# Patient Record
Sex: Male | Born: 1980 | Race: Black or African American | Hispanic: No | Marital: Single | Smoking: Never smoker
Health system: Southern US, Community
[De-identification: ages and names within clinical notes are randomized; demographics above are authoritative.]

## PROBLEM LIST (undated history)

## (undated) DIAGNOSIS — Z006 Encounter for examination for normal comparison and control in clinical research program: Secondary | ICD-10-CM

## (undated) DIAGNOSIS — L309 Dermatitis, unspecified: Secondary | ICD-10-CM

## (undated) DIAGNOSIS — F329 Major depressive disorder, single episode, unspecified: Secondary | ICD-10-CM

## (undated) HISTORY — DX: Encounter for examination for normal comparison and control in clinical research program: Z00.6

## (undated) HISTORY — DX: Major depressive disorder, single episode, unspecified: F32.9

## (undated) HISTORY — DX: Dermatitis, unspecified: L30.9

---
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
## 2008-03-25 DIAGNOSIS — L309 Dermatitis, unspecified: Secondary | ICD-10-CM

## 2008-03-25 HISTORY — DX: Dermatitis, unspecified: L30.9

## 2010-03-25 DIAGNOSIS — F32A Depression, unspecified: Secondary | ICD-10-CM

## 2010-03-25 HISTORY — DX: Depression, unspecified: F32.A

## 2016-02-01 ENCOUNTER — Encounter (INDEPENDENT_AMBULATORY_CARE_PROVIDER_SITE_OTHER): Payer: Self-pay | Admitting: *Deleted

## 2016-02-01 ENCOUNTER — Encounter: Payer: Self-pay | Admitting: *Deleted

## 2016-02-01 VITALS — BP 114/78 | HR 88 | Temp 98.2°F | Wt 204.2 lb

## 2016-02-01 DIAGNOSIS — Z006 Encounter for examination for normal comparison and control in clinical research program: Secondary | ICD-10-CM

## 2016-02-01 LAB — CBC WITH DIFFERENTIAL/PLATELET
Basophils Absolute: 0 cells/uL (ref 0–200)
Basophils Relative: 0 %
Eosinophils Absolute: 45 cells/uL (ref 15–500)
Eosinophils Relative: 1 %
HCT: 48.4 % (ref 38.5–50.0)
Hemoglobin: 15.8 g/dL (ref 13.2–17.1)
Lymphocytes Relative: 39 %
Lymphs Abs: 1755 cells/uL (ref 850–3900)
MCH: 29.1 pg (ref 27.0–33.0)
MCHC: 32.6 g/dL (ref 32.0–36.0)
MCV: 89.1 fL (ref 80.0–100.0)
MPV: 11.4 fL (ref 7.5–12.5)
Monocytes Absolute: 450 cells/uL (ref 200–950)
Monocytes Relative: 10 %
Neutro Abs: 2250 cells/uL (ref 1500–7800)
Neutrophils Relative %: 50 %
Platelets: 223 10*3/uL (ref 140–400)
RBC: 5.43 MIL/uL (ref 4.20–5.80)
RDW: 13.4 % (ref 11.0–15.0)
WBC: 4.5 10*3/uL (ref 3.8–10.8)

## 2016-02-01 NOTE — Progress Notes (Addendum)
Study: A Phase 2b/3 Double Blind Safety and Efficacy Study of Injectable Cabotegravir compared to Daily Oral Tenofovir Disoproxil Fumarate/Emtricitabine (TDF/FTC), For Pre-Exposure Prophylaxis in HIV-Uninfected Cisgender Men and Transgender Women who have sex with Men.  Medication: Investigational Injectable Cabotegravir/placebo compared to Truvada/placebo. Duration: Around 4 years.  Glenn Rivera is here for [id number] screening visit. After verifying the correct version I explained/reviewed the informed consent in the language that he understood. Risk, benefits, responsibilities, and other options were reviewed. I answered his questions. Comprehension was assessed. He was given adequate time to consider his options. He verbalized understanding and signed the consent witnessed by me. I then gave him a copy of the consent.  HIV counseling was given including description of the testing and how it is done; explained HIV and how it is spread and ways to prevent it; Discussed the meaning of the possible test results and what impact the test results may have on the participant. SexPro = 14. He states he was positive for urethral gonorrhea but does not know if it has been with in the past 6 months. PTID assigned. Confirmation of eligibility for screening was confirmed. Blood drawn at 1515 and HIV Rapid confirmed to be negative. Medical history, medications, bleeding history, and signs/symptoms were reviewed. ECG and vitals were obtained. QTcB = 402 ms.  Complete PE to be performed at entry. He received $50 gift card for screening visit. If deemed eligible and he is willing to participant in the study then anticipated entry visit is scheduled for 02/12/2016.

## 2016-02-02 LAB — CK: Total CK: 325 U/L — ABNORMAL HIGH (ref 7–232)

## 2016-02-02 LAB — HEPATITIS B SURFACE ANTIGEN: Hepatitis B Surface Ag: NEGATIVE

## 2016-02-02 LAB — HEPATITIS C ANTIBODY: HCV Ab: NEGATIVE

## 2016-02-02 LAB — HIV ANTIBODY (ROUTINE TESTING W REFLEX): HIV 1&2 Ab, 4th Generation: NONREACTIVE

## 2016-02-05 LAB — HIV-1 RNA, QUALITATIVE, TMA: HIV-1 RNA, Qualitative, TMA: NOT DETECTED

## 2016-02-12 ENCOUNTER — Encounter (INDEPENDENT_AMBULATORY_CARE_PROVIDER_SITE_OTHER): Payer: Self-pay | Admitting: *Deleted

## 2016-02-12 ENCOUNTER — Encounter: Payer: Self-pay | Admitting: *Deleted

## 2016-02-12 VITALS — BP 121/79 | HR 76 | Temp 98.3°F | Wt 206.0 lb

## 2016-02-12 DIAGNOSIS — Z006 Encounter for examination for normal comparison and control in clinical research program: Secondary | ICD-10-CM

## 2016-02-12 HISTORY — DX: Encounter for examination for normal comparison and control in clinical research program: Z00.6

## 2016-02-12 LAB — LIPASE: LIPASE: 22 U/L (ref 7–60)

## 2016-02-12 LAB — CBC WITH DIFFERENTIAL/PLATELET
Basophils Absolute: 0 cells/uL (ref 0–200)
Basophils Relative: 0 %
Eosinophils Absolute: 76 cells/uL (ref 15–500)
Eosinophils Relative: 2 %
HCT: 46.1 % (ref 38.5–50.0)
Hemoglobin: 15.2 g/dL (ref 13.2–17.1)
Lymphocytes Relative: 34 %
Lymphs Abs: 1292 cells/uL (ref 850–3900)
MCH: 29.1 pg (ref 27.0–33.0)
MCHC: 33 g/dL (ref 32.0–36.0)
MCV: 88.3 fL (ref 80.0–100.0)
MPV: 11.4 fL (ref 7.5–12.5)
Monocytes Absolute: 266 cells/uL (ref 200–950)
Monocytes Relative: 7 %
Neutro Abs: 2166 cells/uL (ref 1500–7800)
Neutrophils Relative %: 57 %
Platelets: 225 10*3/uL (ref 140–400)
RBC: 5.22 MIL/uL (ref 4.20–5.80)
RDW: 13.7 % (ref 11.0–15.0)
WBC: 3.8 10*3/uL (ref 3.8–10.8)

## 2016-02-12 LAB — COMPREHENSIVE METABOLIC PANEL
ALT: 47 U/L — ABNORMAL HIGH (ref 9–46)
AST: 122 U/L — ABNORMAL HIGH (ref 10–40)
Albumin: 4.2 g/dL (ref 3.6–5.1)
Alkaline Phosphatase: 51 U/L (ref 40–115)
BUN: 10 mg/dL (ref 7–25)
CO2: 30 mmol/L (ref 20–31)
Calcium: 9.2 mg/dL (ref 8.6–10.3)
Chloride: 101 mmol/L (ref 98–110)
Creat: 1.09 mg/dL (ref 0.60–1.35)
Glucose, Bld: 86 mg/dL (ref 65–99)
Potassium: 4.1 mmol/L (ref 3.5–5.3)
Sodium: 137 mmol/L (ref 135–146)
Total Bilirubin: 0.6 mg/dL (ref 0.2–1.2)
Total Protein: 6.9 g/dL (ref 6.1–8.1)

## 2016-02-12 LAB — LIPID PANEL
Cholesterol: 162 mg/dL (ref <200)
HDL: 89 mg/dL (ref >40)
LDL Cholesterol: 65 mg/dL (ref <100)
Total CHOL/HDL Ratio: 1.8 Ratio (ref <5.0)
Triglycerides: 41 mg/dL (ref <150)
VLDL: 8 mg/dL (ref <30)

## 2016-02-12 LAB — AMYLASE: Amylase: 47 U/L (ref 0–105)

## 2016-02-12 LAB — PHOSPHORUS: PHOSPHORUS: 3.4 mg/dL (ref 2.5–4.5)

## 2016-02-12 LAB — HIV ANTIBODY (ROUTINE TESTING W REFLEX): HIV: NONREACTIVE

## 2016-02-12 LAB — CK: Total CK: 13864 U/L (ref 7–232)

## 2016-02-12 LAB — HEPATITIS B CORE ANTIBODY, TOTAL: HEP B C TOTAL AB: NONREACTIVE

## 2016-02-12 LAB — HEPATITIS B SURFACE ANTIBODY,QUALITATIVE: Hep B S Ab: NEGATIVE

## 2016-02-12 NOTE — Progress Notes (Signed)
Subjective:    Patient ID: Glenn ReichmannDaniel Rivera, male    DOB: 01/10/1981, 35 y.o.   MRN: [id number]  HPI  Glenn BoomDaniel Is a 35 year old African-American man here for comprehensive physical exam prior to entry into H PT and 083:  A Phase 2b/3 Double Blind Safety and Efficacy Study of Injectable Cabotegravir compared to Daily Oral Tenofovir Disoproxil Fumarate/Emtricitabine (TDF/FTC), For Pre-Exposure Prophylaxis in HIV-Uninfected Cisgender Men and Transgender Women who have sex with Men.    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   He does at times experience eczema on his feet when he is stressed out. He also has a tooth that is broken in need of dental care.  Past Medical History:  Diagnosis Date  . Depression 03/25/2010   History  . Eczema 2010   intermittent / seasonal on soles of feet bilaterally  . Examination of participant or control in clinical research 02/12/2016    No past surgical history on file.  No family history on file.    Social History   Social History  . Marital status: Single    Spouse name: N/A  . Number of children: N/A  . Years of education: N/A   Social History Main Topics  . Smoking status: Never Smoker  . Smokeless tobacco: Never Used  . Alcohol use 1.2 oz/week    2 Shots of liquor per week     Comment: 1x/month  . Drug use: No  . Sexual activity: Yes     Comment: Pansexual   Other Topics Concern  . None   Social History Narrative  . None    Allergies  Allergen Reactions  . Lactose Intolerance (Gi) Diarrhea and Other (See Comments)    Diarrhea, upset stomach, and migraine    No current outpatient prescriptions on file.   Review of Systems  Constitutional: Negative for chills, diaphoresis and fever.  HENT: Negative for congestion, hearing loss, sore throat and tinnitus.   Respiratory: Negative for cough, shortness of breath and wheezing.   Cardiovascular: Negative for chest pain, palpitations and  leg swelling.  Gastrointestinal: Negative for abdominal pain, blood in stool, constipation, diarrhea, nausea and vomiting.  Genitourinary: Negative for dysuria, flank pain and hematuria.  Musculoskeletal: Negative for back pain and myalgias.  Skin: Negative for rash.  Neurological: Negative for dizziness, weakness and headaches.  Hematological: Does not bruise/bleed easily.  Psychiatric/Behavioral: Negative for suicidal ideas. The patient is not nervous/anxious.        Objective:   Physical Exam  Constitutional: He is oriented to person, place, and time. He appears well-developed and well-nourished. No distress.  HENT:  Head: Normocephalic and atraumatic.  Mouth/Throat: Oropharynx is clear and moist. No oropharyngeal exudate.    Eyes: Conjunctivae and EOM are normal. Pupils are equal, round, and reactive to light. Right eye exhibits no discharge. Left eye exhibits no discharge. No scleral icterus.  Neck: Normal range of motion. Neck supple. No JVD present. No tracheal deviation present. No thyromegaly present.  Cardiovascular: Normal rate, regular rhythm and normal heart sounds.  Exam reveals no gallop and no friction rub.   No murmur heard. Pulmonary/Chest: Effort normal and breath sounds normal. No respiratory distress. He has no wheezes. He has no rales. He exhibits no tenderness.  Abdominal: Soft. Bowel sounds are normal. He exhibits no distension. There is no tenderness. There is no rebound.  Musculoskeletal: He exhibits no edema or tenderness.  Lymphadenopathy:       Head (  right side): No submental, no submandibular, no tonsillar, no preauricular, no posterior auricular and no occipital adenopathy present.       Head (left side): No submental, no submandibular, no tonsillar, no preauricular, no posterior auricular and no occipital adenopathy present.    He has no cervical adenopathy.       Right: No supraclavicular adenopathy present.       Left: No supraclavicular adenopathy  present.  Neurological: He is alert and oriented to person, place, and time. No cranial nerve deficit or sensory deficit. He exhibits normal muscle tone. Coordination normal. GCS eye subscore is 4. GCS verbal subscore is 5. GCS motor subscore is 6.  Skin: Skin is warm and dry. No rash noted. He is not diaphoretic. No erythema. No pallor.  Psychiatric: He has a normal mood and affect. His behavior is normal. Judgment and thought content normal.          Assessment & Plan:   Normal CPE. EKG was normal sinus rhythm labs were normal other than slightly elevated CPK which is due to his exercise.  All questions were answered asked and answered.  Patient should be ready to start study drugs today.

## 2016-02-12 NOTE — Progress Notes (Addendum)
Study: A Phase 2b/3 Double Blind Safety and Efficacy Study of Injectable Cabotegravir compared to Daily Oral Tenofovir Disoproxil Fumarate/Emtricitabine (TDF/FTC), For Pre-Exposure Prophylaxis in HIV-Uninfected Cisgender Men and Transgender Women who have sex with Men.  Medication: Investigational Injectable Cabotegravir/placebo compared to Truvada/placebo. Duration: Around 4 years.  Reuel BoomDaniel is here for entry. Was seen by PI for CPE. HIV rapid was negative. He reports no new issues since screening visit. He was randomized to study. Questionnaires completed; vitals obtained; Discussed proper administration of study product, potential side effects, and contact information incase he has any concerns/questions. He verbalized understanding. He plans to start study drug tonight and will use a reminder on phone as well as support from friends to remember to take study meds. Will see him in 2 weeks for adherence and safety check. He received $50 giftcard for visit and condoms/lube. Tacey HeapElisha Shameria Trimarco RN

## 2016-02-13 LAB — GC/CHLAMYDIA PROBE AMP
CT Probe RNA: NOT DETECTED
GC PROBE AMP APTIMA: NOT DETECTED

## 2016-02-13 LAB — URINALYSIS
Bilirubin Urine: NEGATIVE
Glucose, UA: NEGATIVE
HGB URINE DIPSTICK: NEGATIVE
Ketones, ur: NEGATIVE
NITRITE: NEGATIVE
PROTEIN: NEGATIVE
Specific Gravity, Urine: 1.012 (ref 1.001–1.035)
pH: 8 (ref 5.0–8.0)

## 2016-02-13 LAB — RPR

## 2016-02-15 LAB — CT/NG RNA, TMA RECTAL
Chlamydia Trachomatis RNA: NOT DETECTED
Neisseria Gonorrhoeae RNA: NOT DETECTED

## 2016-02-19 ENCOUNTER — Encounter: Payer: Self-pay | Admitting: *Deleted

## 2016-02-19 DIAGNOSIS — Z006 Encounter for examination for normal comparison and control in clinical research program: Secondary | ICD-10-CM

## 2016-02-19 LAB — COMPREHENSIVE METABOLIC PANEL
ALBUMIN: 4.2 g/dL (ref 3.6–5.1)
ALK PHOS: 56 U/L (ref 40–115)
ALT: 56 U/L — AB (ref 9–46)
AST: 46 U/L — AB (ref 10–40)
BILIRUBIN TOTAL: 0.4 mg/dL (ref 0.2–1.2)
BUN: 11 mg/dL (ref 7–25)
CALCIUM: 9.3 mg/dL (ref 8.6–10.3)
CO2: 26 mmol/L (ref 20–31)
CREATININE: 1.14 mg/dL (ref 0.60–1.35)
Chloride: 105 mmol/L (ref 98–110)
Glucose, Bld: 83 mg/dL (ref 65–99)
Potassium: 4.3 mmol/L (ref 3.5–5.3)
Sodium: 140 mmol/L (ref 135–146)
TOTAL PROTEIN: 6.8 g/dL (ref 6.1–8.1)

## 2016-02-19 LAB — CK: Total CK: 1080 U/L — ABNORMAL HIGH (ref 7–232)

## 2016-02-19 NOTE — Progress Notes (Signed)
Glenn Rivera is here for a redraw of his CPK and CMET prior to him restarting his study meds,. He says he has not worked out the past few days and his muscles are not sore. He will wait to hear from us before resuming his medication.

## 2016-02-23 ENCOUNTER — Encounter: Payer: Self-pay | Admitting: *Deleted

## 2016-02-23 DIAGNOSIS — Z006 Encounter for examination for normal comparison and control in clinical research program: Secondary | ICD-10-CM

## 2016-02-28 NOTE — Assessment & Plan Note (Signed)
After obtaining labs on participant at [id number] and discussing situation with protocol team the participant was started on study drug on [id number]. Participant was informed and will start 01DEC2017.

## 2016-02-28 NOTE — Assessment & Plan Note (Signed)
Site aware that all labs were not obtained at screening visit. Contacted study participant and informed him of the situation and that he SHOULD NOT start study medications. He verbalized understanding. I reported findings to protocol team. Tacey HeapElisha Epperson RN

## 2016-03-04 ENCOUNTER — Encounter (INDEPENDENT_AMBULATORY_CARE_PROVIDER_SITE_OTHER): Payer: Self-pay | Admitting: *Deleted

## 2016-03-04 VITALS — BP 123/75 | HR 69 | Temp 97.7°F | Wt 199.5 lb

## 2016-03-04 DIAGNOSIS — Z006 Encounter for examination for normal comparison and control in clinical research program: Secondary | ICD-10-CM

## 2016-03-04 LAB — CBC WITH DIFFERENTIAL/PLATELET
BASOS PCT: 1 %
Basophils Absolute: 37 cells/uL (ref 0–200)
EOS PCT: 2 %
Eosinophils Absolute: 74 cells/uL (ref 15–500)
HCT: 49.1 % (ref 38.5–50.0)
HEMOGLOBIN: 16.4 g/dL (ref 13.2–17.1)
Lymphocytes Relative: 36 %
Lymphs Abs: 1332 cells/uL (ref 850–3900)
MCH: 29.8 pg (ref 27.0–33.0)
MCHC: 33.4 g/dL (ref 32.0–36.0)
MCV: 89.1 fL (ref 80.0–100.0)
MPV: 11.7 fL (ref 7.5–12.5)
Monocytes Absolute: 333 cells/uL (ref 200–950)
Monocytes Relative: 9 %
NEUTROS ABS: 1924 {cells}/uL (ref 1500–7800)
Neutrophils Relative %: 52 %
Platelets: 223 10*3/uL (ref 140–400)
RBC: 5.51 MIL/uL (ref 4.20–5.80)
RDW: 13.5 % (ref 11.0–15.0)
WBC: 3.7 10*3/uL — AB (ref 3.8–10.8)

## 2016-03-04 LAB — COMPREHENSIVE METABOLIC PANEL
ALK PHOS: 62 U/L (ref 40–115)
ALT: 30 U/L (ref 9–46)
AST: 23 U/L (ref 10–40)
Albumin: 4.2 g/dL (ref 3.6–5.1)
BUN: 12 mg/dL (ref 7–25)
CALCIUM: 9.5 mg/dL (ref 8.6–10.3)
CO2: 28 mmol/L (ref 20–31)
Chloride: 104 mmol/L (ref 98–110)
Creat: 1.15 mg/dL (ref 0.60–1.35)
GLUCOSE: 81 mg/dL (ref 65–99)
POTASSIUM: 4.6 mmol/L (ref 3.5–5.3)
Sodium: 139 mmol/L (ref 135–146)
TOTAL PROTEIN: 6.8 g/dL (ref 6.1–8.1)
Total Bilirubin: 0.6 mg/dL (ref 0.2–1.2)

## 2016-03-04 LAB — PHOSPHORUS: PHOSPHORUS: 3.2 mg/dL (ref 2.5–4.5)

## 2016-03-04 NOTE — Progress Notes (Signed)
Glenn Rivera is here for his week 2 visit for HPTN. He actually started meds on the 3rd. He said [place] was [place] [facility] [place] to start on [place] [facility] and didn't have them with him. Since that time he has been 100% adherent. I reinforced adherence with him and have the next study appt. scheduled for 12/22 (week 4). He says he has not noticed any side effects from the medications or had any other problems.

## 2016-03-05 LAB — AMYLASE: AMYLASE: 44 U/L (ref 0–105)

## 2016-03-05 LAB — CK: Total CK: 195 U/L (ref 7–232)

## 2016-03-05 LAB — HIV ANTIBODY (ROUTINE TESTING W REFLEX): HIV: NONREACTIVE

## 2016-03-05 LAB — LIPASE: Lipase: 43 U/L (ref 7–60)

## 2016-03-15 ENCOUNTER — Encounter (INDEPENDENT_AMBULATORY_CARE_PROVIDER_SITE_OTHER): Payer: Self-pay | Admitting: *Deleted

## 2016-03-15 VITALS — BP 117/81 | HR 91 | Temp 98.3°F | Resp 16 | Wt 199.0 lb

## 2016-03-15 DIAGNOSIS — Z006 Encounter for examination for normal comparison and control in clinical research program: Secondary | ICD-10-CM

## 2016-03-15 LAB — CBC WITH DIFFERENTIAL/PLATELET
BASOS ABS: 40 {cells}/uL (ref 0–200)
BASOS PCT: 1 %
EOS PCT: 2 %
Eosinophils Absolute: 80 cells/uL (ref 15–500)
HCT: 48.8 % (ref 38.5–50.0)
Hemoglobin: 16.2 g/dL (ref 13.2–17.1)
LYMPHS PCT: 42 %
Lymphs Abs: 1680 cells/uL (ref 850–3900)
MCH: 29.8 pg (ref 27.0–33.0)
MCHC: 33.2 g/dL (ref 32.0–36.0)
MCV: 89.9 fL (ref 80.0–100.0)
MONOS PCT: 8 %
MPV: 11.5 fL (ref 7.5–12.5)
Monocytes Absolute: 320 cells/uL (ref 200–950)
Neutro Abs: 1880 cells/uL (ref 1500–7800)
Neutrophils Relative %: 47 %
PLATELETS: 251 10*3/uL (ref 140–400)
RBC: 5.43 MIL/uL (ref 4.20–5.80)
RDW: 13.5 % (ref 11.0–15.0)
WBC: 4 10*3/uL (ref 3.8–10.8)

## 2016-03-15 NOTE — Progress Notes (Signed)
Glenn Rivera is here for his week 4 visit for Study: A Phase 2b/3 Double Blind Safety and Efficacy Study of Injectable Cabotegravir compared to Daily Oral Tenofovir Disoproxil Fumarate/Emtricitabine (TDF/FTC), For Pre-Exposure Prophylaxis in HIV-Uninfected Cisgender Men and Transgender Women who have sex with Men.  Medication: Investigational Injectable Cabotegravir/placebo compared to Truvada/placebo. Duration: Around 4 years.  He had actually started his meds on 12/3 and has taken 16 doses since then which puts his actual adherence at 84%. He had an exposure to dairy 2 days ago and had a reaction yesterday that consisted of migraine, loose stools and slight nausea. He had taken a probiotic for this and melatonin to help him sleep last night. He said he had been having trouble sleeping this past week before that happened. He has also noticed an increase in libido since starting the meds. He will be moving in to the injection phase of the study next week and is scheduled for Thursday at 11:30am.

## 2016-03-16 LAB — COMPREHENSIVE METABOLIC PANEL
ALBUMIN: 4.2 g/dL (ref 3.6–5.1)
ALK PHOS: 67 U/L (ref 40–115)
ALT: 32 U/L (ref 9–46)
AST: 24 U/L (ref 10–40)
BILIRUBIN TOTAL: 0.4 mg/dL (ref 0.2–1.2)
BUN: 12 mg/dL (ref 7–25)
CO2: 22 mmol/L (ref 20–31)
CREATININE: 1.02 mg/dL (ref 0.60–1.35)
Calcium: 9.7 mg/dL (ref 8.6–10.3)
Chloride: 102 mmol/L (ref 98–110)
Glucose, Bld: 90 mg/dL (ref 65–99)
Potassium: 4.3 mmol/L (ref 3.5–5.3)
Sodium: 136 mmol/L (ref 135–146)
Total Protein: 6.9 g/dL (ref 6.1–8.1)

## 2016-03-16 LAB — HIV ANTIBODY (ROUTINE TESTING W REFLEX): HIV 1&2 Ab, 4th Generation: NONREACTIVE

## 2016-03-16 LAB — PHOSPHORUS: Phosphorus: 4.2 mg/dL (ref 2.5–4.5)

## 2016-03-16 LAB — CK: Total CK: 176 U/L (ref 7–232)

## 2016-03-16 LAB — AMYLASE: Amylase: 53 U/L (ref 0–105)

## 2016-03-16 LAB — LIPASE: Lipase: 42 U/L (ref 7–60)

## 2016-03-21 ENCOUNTER — Encounter (INDEPENDENT_AMBULATORY_CARE_PROVIDER_SITE_OTHER): Payer: Self-pay | Admitting: *Deleted

## 2016-03-21 VITALS — BP 129/78 | HR 78 | Temp 98.3°F | Wt 203.5 lb

## 2016-03-21 DIAGNOSIS — Z006 Encounter for examination for normal comparison and control in clinical research program: Secondary | ICD-10-CM

## 2016-03-21 NOTE — Progress Notes (Signed)
Glenn Rivera is here for his week 5 visit for Study: A Phase 2b/3 Double Blind Safety and Efficacy Study of Injectable Cabotegravir compared to Daily Oral Tenofovir Disoproxil Fumarate/Emtricitabine (TDF/FTC), For Pre-Exposure Prophylaxis in HIV-Uninfected Cisgender Men and Transgender Women who have sex with Men.  Medication: Investigational Injectable Cabotegravir/placebo compared to Truvada/placebo. Duration: Around 4 years.  This is his first injection visit. We confirmed his rapid HIV test was negative prior to the injection. He denied any symptoms consistent with acute HIV, though he did have 1 unprotected encounter with a new partner. He says he is sleeping better and not needing to use the melatonin right now. The injection was given in his rt buttock without problem. He was instructed to call for any problems and will return next week for safety labs.

## 2016-03-22 LAB — HIV ANTIBODY (ROUTINE TESTING W REFLEX): HIV: NONREACTIVE

## 2016-03-28 ENCOUNTER — Encounter (INDEPENDENT_AMBULATORY_CARE_PROVIDER_SITE_OTHER): Payer: Self-pay | Admitting: *Deleted

## 2016-03-28 VITALS — BP 111/73 | HR 82 | Temp 97.8°F | Wt 198.5 lb

## 2016-03-28 DIAGNOSIS — Z006 Encounter for examination for normal comparison and control in clinical research program: Secondary | ICD-10-CM

## 2016-03-28 LAB — CBC WITH DIFFERENTIAL/PLATELET
BASOS PCT: 0 %
Basophils Absolute: 0 cells/uL (ref 0–200)
EOS ABS: 74 {cells}/uL (ref 15–500)
Eosinophils Relative: 2 %
HEMATOCRIT: 50 % (ref 38.5–50.0)
HEMOGLOBIN: 16.7 g/dL (ref 13.2–17.1)
Lymphocytes Relative: 38 %
Lymphs Abs: 1406 cells/uL (ref 850–3900)
MCH: 29.7 pg (ref 27.0–33.0)
MCHC: 33.4 g/dL (ref 32.0–36.0)
MCV: 89 fL (ref 80.0–100.0)
MONO ABS: 296 {cells}/uL (ref 200–950)
MPV: 11.4 fL (ref 7.5–12.5)
Monocytes Relative: 8 %
NEUTROS ABS: 1924 {cells}/uL (ref 1500–7800)
Neutrophils Relative %: 52 %
Platelets: 259 10*3/uL (ref 140–400)
RBC: 5.62 MIL/uL (ref 4.20–5.80)
RDW: 13.4 % (ref 11.0–15.0)
WBC: 3.7 10*3/uL — AB (ref 3.8–10.8)

## 2016-03-28 LAB — COMPREHENSIVE METABOLIC PANEL
ALK PHOS: 59 U/L (ref 40–115)
ALT: 29 U/L (ref 9–46)
AST: 23 U/L (ref 10–40)
Albumin: 4 g/dL (ref 3.6–5.1)
BILIRUBIN TOTAL: 0.4 mg/dL (ref 0.2–1.2)
BUN: 9 mg/dL (ref 7–25)
CALCIUM: 9.3 mg/dL (ref 8.6–10.3)
CO2: 25 mmol/L (ref 20–31)
Chloride: 102 mmol/L (ref 98–110)
Creat: 1.11 mg/dL (ref 0.60–1.35)
GLUCOSE: 70 mg/dL (ref 65–99)
POTASSIUM: 4.1 mmol/L (ref 3.5–5.3)
Sodium: 140 mmol/L (ref 135–146)
TOTAL PROTEIN: 6.7 g/dL (ref 6.1–8.1)

## 2016-03-28 LAB — PHOSPHORUS: PHOSPHORUS: 2.9 mg/dL (ref 2.5–4.5)

## 2016-03-28 NOTE — Progress Notes (Signed)
Study: A Phase 2b/3 Double Blind Safety and Efficacy Study of Injectable Cabotegravir compared to Daily Oral Tenofovir Disoproxil Fumarate/Emtricitabine (TDF/FTC), For Pre-Exposure Prophylaxis in HIV-Uninfected Cisgender Men and Transgender Women who have sex with Men.  Medication: Investigational Injectable Cabotegravir/placebo compared to Truvada/placebo. Duration: Around 4 years.  Glenn Rivera is here for week 6 visit. Denies any injection site reaction. Verbalized excellent adherence with his oral study medication. Rapid HIV non-reactive. Risk reduction counseling provided. Declined condoms and lube stating that he "had plenty". Next visit scheduled for 04/16/16 @ 8:30am.

## 2016-03-29 LAB — HIV ANTIBODY (ROUTINE TESTING W REFLEX): HIV 1&2 Ab, 4th Generation: NONREACTIVE

## 2016-03-29 LAB — LIPASE: Lipase: 34 U/L (ref 7–60)

## 2016-03-29 LAB — CK: Total CK: 173 U/L (ref 7–232)

## 2016-03-29 LAB — AMYLASE: AMYLASE: 49 U/L (ref 0–105)

## 2016-04-26 ENCOUNTER — Encounter (INDEPENDENT_AMBULATORY_CARE_PROVIDER_SITE_OTHER): Payer: Self-pay | Admitting: *Deleted

## 2016-04-26 VITALS — BP 131/78 | HR 93 | Temp 97.6°F | Wt 197.2 lb

## 2016-04-26 DIAGNOSIS — Z006 Encounter for examination for normal comparison and control in clinical research program: Secondary | ICD-10-CM

## 2016-04-26 LAB — COMPREHENSIVE METABOLIC PANEL
ALT: 26 U/L (ref 9–46)
AST: 21 U/L (ref 10–40)
Albumin: 4 g/dL (ref 3.6–5.1)
Alkaline Phosphatase: 54 U/L (ref 40–115)
BUN: 15 mg/dL (ref 7–25)
CHLORIDE: 104 mmol/L (ref 98–110)
CO2: 28 mmol/L (ref 20–31)
CREATININE: 1.2 mg/dL (ref 0.60–1.35)
Calcium: 9.1 mg/dL (ref 8.6–10.3)
GLUCOSE: 90 mg/dL (ref 65–99)
POTASSIUM: 3.9 mmol/L (ref 3.5–5.3)
SODIUM: 137 mmol/L (ref 135–146)
Total Bilirubin: 0.5 mg/dL (ref 0.2–1.2)
Total Protein: 6.7 g/dL (ref 6.1–8.1)

## 2016-04-26 LAB — CBC WITH DIFFERENTIAL/PLATELET
BASOS ABS: 36 {cells}/uL (ref 0–200)
Basophils Relative: 1 %
EOS PCT: 2 %
Eosinophils Absolute: 72 cells/uL (ref 15–500)
HCT: 46.5 % (ref 38.5–50.0)
HEMOGLOBIN: 15.7 g/dL (ref 13.2–17.1)
LYMPHS ABS: 1188 {cells}/uL (ref 850–3900)
Lymphocytes Relative: 33 %
MCH: 29.7 pg (ref 27.0–33.0)
MCHC: 33.8 g/dL (ref 32.0–36.0)
MCV: 87.9 fL (ref 80.0–100.0)
MONOS PCT: 9 %
MPV: 10.8 fL (ref 7.5–12.5)
Monocytes Absolute: 324 cells/uL (ref 200–950)
NEUTROS PCT: 55 %
Neutro Abs: 1980 cells/uL (ref 1500–7800)
PLATELETS: 229 10*3/uL (ref 140–400)
RBC: 5.29 MIL/uL (ref 4.20–5.80)
RDW: 13.8 % (ref 11.0–15.0)
WBC: 3.6 10*3/uL — AB (ref 3.8–10.8)

## 2016-04-26 LAB — CK: CK TOTAL: 174 U/L (ref 7–232)

## 2016-04-26 LAB — LIPASE: Lipase: 33 U/L (ref 7–60)

## 2016-04-26 LAB — PHOSPHORUS: Phosphorus: 3.2 mg/dL (ref 2.5–4.5)

## 2016-04-26 LAB — AMYLASE: AMYLASE: 46 U/L (ref 0–105)

## 2016-04-26 NOTE — Progress Notes (Signed)
Study: A Phase 2b/3 Double Blind Safety and Efficacy Study of Injectable Cabotegravir compared to Daily Oral Tenofovir Disoproxil Fumarate/Emtricitabine (TDF/FTC), For Pre-Exposure Prophylaxis in HIV-Uninfected Cisgender Men and Transgender Women who have sex with Men.  Medication: Investigational Injectable Cabotegravir/placebo compared to Truvada/placebo. Duration: Around 4 years.  Glenn Rivera is here for week 9, second injection. He reports no new issues since last stud visit. He has not worked out in 1 week. Blood was drawn with no problems and HIV rapid confirmed non-reactive. He returned #24 pills of oral study product. Questionnaires completed. Injection given in right gluteal muscle with no problems. He received $50 gift card and condoms. Next appointment scheduled for 05/03/2016 @ 8:30.

## 2016-04-27 LAB — HIV ANTIBODY (ROUTINE TESTING W REFLEX): HIV: NONREACTIVE

## 2016-05-03 ENCOUNTER — Encounter (INDEPENDENT_AMBULATORY_CARE_PROVIDER_SITE_OTHER): Payer: Self-pay | Admitting: *Deleted

## 2016-05-03 VITALS — BP 141/84 | HR 84 | Temp 97.8°F | Wt 197.0 lb

## 2016-05-03 DIAGNOSIS — Z006 Encounter for examination for normal comparison and control in clinical research program: Secondary | ICD-10-CM

## 2016-05-03 LAB — COMPREHENSIVE METABOLIC PANEL
ALBUMIN: 4.1 g/dL (ref 3.6–5.1)
ALT: 24 U/L (ref 9–46)
AST: 20 U/L (ref 10–40)
Alkaline Phosphatase: 55 U/L (ref 40–115)
BILIRUBIN TOTAL: 0.5 mg/dL (ref 0.2–1.2)
BUN: 13 mg/dL (ref 7–25)
CO2: 27 mmol/L (ref 20–31)
CREATININE: 1.07 mg/dL (ref 0.60–1.35)
Calcium: 9.6 mg/dL (ref 8.6–10.3)
Chloride: 103 mmol/L (ref 98–110)
Glucose, Bld: 89 mg/dL (ref 65–99)
Potassium: 3.9 mmol/L (ref 3.5–5.3)
SODIUM: 138 mmol/L (ref 135–146)
TOTAL PROTEIN: 6.8 g/dL (ref 6.1–8.1)

## 2016-05-03 LAB — CBC WITH DIFFERENTIAL/PLATELET
BASOS ABS: 0 {cells}/uL (ref 0–200)
Basophils Relative: 0 %
EOS ABS: 38 {cells}/uL (ref 15–500)
Eosinophils Relative: 1 %
HCT: 47.1 % (ref 38.5–50.0)
HEMOGLOBIN: 15.7 g/dL (ref 13.2–17.1)
LYMPHS ABS: 1330 {cells}/uL (ref 850–3900)
Lymphocytes Relative: 35 %
MCH: 29.6 pg (ref 27.0–33.0)
MCHC: 33.3 g/dL (ref 32.0–36.0)
MCV: 88.9 fL (ref 80.0–100.0)
MPV: 11.2 fL (ref 7.5–12.5)
Monocytes Absolute: 342 cells/uL (ref 200–950)
Monocytes Relative: 9 %
NEUTROS ABS: 2090 {cells}/uL (ref 1500–7800)
NEUTROS PCT: 55 %
Platelets: 235 10*3/uL (ref 140–400)
RBC: 5.3 MIL/uL (ref 4.20–5.80)
RDW: 13.7 % (ref 11.0–15.0)
WBC: 3.8 10*3/uL (ref 3.8–10.8)

## 2016-05-03 LAB — CK: Total CK: 168 U/L (ref 7–232)

## 2016-05-03 LAB — LIPASE: LIPASE: 27 U/L (ref 7–60)

## 2016-05-03 LAB — AMYLASE: Amylase: 45 U/L (ref 0–105)

## 2016-05-03 LAB — PHOSPHORUS: PHOSPHORUS: 3.2 mg/dL (ref 2.5–4.5)

## 2016-05-03 NOTE — Progress Notes (Signed)
Study: A Phase 2b/3 Double Blind Safety and Efficacy Study of Injectable Cabotegravir compared to Daily Oral Tenofovir Disoproxil Fumarate/Emtricitabine (TDF/FTC), For Pre-Exposure Prophylaxis in HIV-Uninfected Cisgender Men and Transgender Women who have sex with Men.  Medication: Investigational Injectable Cabotegravir/placebo compared to Truvada/placebo. Duration: Around 4 years.  Glenn Rivera is here for week 10. He has no issues to report. No reaction to the injection. He has not worked out since last visit. HIV rapid is nonreactive. Questionnaire completed. He received $50 gift card and declined condoms. Next appointment scheduled for 06/10/2016 @ 8:30am. Tacey HeapElisha Epperson RN

## 2016-05-04 LAB — HIV ANTIBODY (ROUTINE TESTING W REFLEX): HIV: NONREACTIVE

## 2016-06-10 ENCOUNTER — Encounter (INDEPENDENT_AMBULATORY_CARE_PROVIDER_SITE_OTHER): Payer: Self-pay | Admitting: *Deleted

## 2016-06-10 VITALS — BP 127/84 | HR 72 | Temp 97.9°F | Wt 200.5 lb

## 2016-06-10 DIAGNOSIS — Z006 Encounter for examination for normal comparison and control in clinical research program: Secondary | ICD-10-CM

## 2016-06-10 LAB — CBC WITH DIFFERENTIAL/PLATELET
BASOS PCT: 1 %
Basophils Absolute: 42 cells/uL (ref 0–200)
EOS ABS: 168 {cells}/uL (ref 15–500)
EOS PCT: 4 %
HCT: 47.3 % (ref 38.5–50.0)
Hemoglobin: 15.9 g/dL (ref 13.2–17.1)
LYMPHS PCT: 39 %
Lymphs Abs: 1638 cells/uL (ref 850–3900)
MCH: 30.3 pg (ref 27.0–33.0)
MCHC: 33.6 g/dL (ref 32.0–36.0)
MCV: 90.1 fL (ref 80.0–100.0)
MONOS PCT: 9 %
MPV: 11 fL (ref 7.5–12.5)
Monocytes Absolute: 378 cells/uL (ref 200–950)
NEUTROS ABS: 1974 {cells}/uL (ref 1500–7800)
Neutrophils Relative %: 47 %
Platelets: 218 10*3/uL (ref 140–400)
RBC: 5.25 MIL/uL (ref 4.20–5.80)
RDW: 14 % (ref 11.0–15.0)
WBC: 4.2 10*3/uL (ref 3.8–10.8)

## 2016-06-10 LAB — COMPREHENSIVE METABOLIC PANEL
ALBUMIN: 3.9 g/dL (ref 3.6–5.1)
ALK PHOS: 63 U/L (ref 40–115)
ALT: 34 U/L (ref 9–46)
AST: 26 U/L (ref 10–40)
BILIRUBIN TOTAL: 0.4 mg/dL (ref 0.2–1.2)
BUN: 13 mg/dL (ref 7–25)
CALCIUM: 9.6 mg/dL (ref 8.6–10.3)
CO2: 31 mmol/L (ref 20–31)
CREATININE: 1.14 mg/dL (ref 0.60–1.35)
Chloride: 104 mmol/L (ref 98–110)
Glucose, Bld: 90 mg/dL (ref 65–99)
Potassium: 4.4 mmol/L (ref 3.5–5.3)
SODIUM: 139 mmol/L (ref 135–146)
TOTAL PROTEIN: 6.4 g/dL (ref 6.1–8.1)

## 2016-06-10 LAB — PHOSPHORUS: PHOSPHORUS: 4 mg/dL (ref 2.5–4.5)

## 2016-06-10 LAB — LIPASE: LIPASE: 37 U/L (ref 7–60)

## 2016-06-10 LAB — CK: Total CK: 272 U/L — ABNORMAL HIGH (ref 7–232)

## 2016-06-10 LAB — HIV ANTIBODY (ROUTINE TESTING W REFLEX): HIV 1&2 Ab, 4th Generation: NONREACTIVE

## 2016-06-10 LAB — AMYLASE: Amylase: 41 U/L (ref 0–105)

## 2016-06-10 NOTE — Progress Notes (Signed)
Glenn Rivera is here for his week 17 visit for Study: A Phase 2b/3 Double Blind Safety and Efficacy Study of Injectable Cabotegravir compared to Daily Oral Tenofovir Disoproxil Fumarate/Emtricitabine (TDF/FTC), For Pre-Exposure Prophylaxis in HIV-Uninfected Cisgender Men and Transgender Women who have sex with Men.  Medication: Investigational Injectable Cabotegravir/placebo compared to Truvada/placebo. Duration: Around 4 years.  Glenn Rivera says Glenn Rivera is doing fine and not having any problems. Glenn Rivera did forget to bring his meds back with him, but feels like Glenn Rivera was very adherent. Glenn Rivera uses phone alerts to remind him to take them everyday. Glenn Rivera was given an injection of study drug/placebo in his left buttock without problem. Glenn Rivera is to return in 2 weeks for the safety visit.

## 2016-06-24 ENCOUNTER — Encounter (INDEPENDENT_AMBULATORY_CARE_PROVIDER_SITE_OTHER): Payer: BLUE CROSS/BLUE SHIELD | Admitting: *Deleted

## 2016-06-24 VITALS — BP 129/73 | HR 99 | Temp 98.4°F | Wt 197.5 lb

## 2016-06-24 DIAGNOSIS — Z006 Encounter for examination for normal comparison and control in clinical research program: Secondary | ICD-10-CM

## 2016-06-24 LAB — CK: CK TOTAL: 243 U/L — AB (ref 7–232)

## 2016-06-24 LAB — CBC WITH DIFFERENTIAL/PLATELET
BASOS PCT: 1 %
Basophils Absolute: 46 cells/uL (ref 0–200)
EOS PCT: 4 %
Eosinophils Absolute: 184 cells/uL (ref 15–500)
HCT: 48.3 % (ref 38.5–50.0)
Hemoglobin: 16.2 g/dL (ref 13.2–17.1)
LYMPHS PCT: 39 %
Lymphs Abs: 1794 cells/uL (ref 850–3900)
MCH: 30.1 pg (ref 27.0–33.0)
MCHC: 33.5 g/dL (ref 32.0–36.0)
MCV: 89.8 fL (ref 80.0–100.0)
MONOS PCT: 6 %
MPV: 11.2 fL (ref 7.5–12.5)
Monocytes Absolute: 276 cells/uL (ref 200–950)
NEUTROS ABS: 2300 {cells}/uL (ref 1500–7800)
Neutrophils Relative %: 50 %
PLATELETS: 244 10*3/uL (ref 140–400)
RBC: 5.38 MIL/uL (ref 4.20–5.80)
RDW: 14.2 % (ref 11.0–15.0)
WBC: 4.6 10*3/uL (ref 3.8–10.8)

## 2016-06-24 LAB — HIV ANTIBODY (ROUTINE TESTING W REFLEX): HIV 1&2 Ab, 4th Generation: NONREACTIVE

## 2016-06-24 LAB — COMPREHENSIVE METABOLIC PANEL
ALK PHOS: 65 U/L (ref 40–115)
ALT: 50 U/L — AB (ref 9–46)
AST: 29 U/L (ref 10–40)
Albumin: 4 g/dL (ref 3.6–5.1)
BILIRUBIN TOTAL: 0.6 mg/dL (ref 0.2–1.2)
BUN: 11 mg/dL (ref 7–25)
CALCIUM: 9.7 mg/dL (ref 8.6–10.3)
CO2: 28 mmol/L (ref 20–31)
Chloride: 103 mmol/L (ref 98–110)
Creat: 1.3 mg/dL (ref 0.60–1.35)
GLUCOSE: 95 mg/dL (ref 65–99)
POTASSIUM: 4.1 mmol/L (ref 3.5–5.3)
Sodium: 138 mmol/L (ref 135–146)
Total Protein: 6.9 g/dL (ref 6.1–8.1)

## 2016-06-24 LAB — LIPASE: Lipase: 33 U/L (ref 7–60)

## 2016-06-24 LAB — PHOSPHORUS: PHOSPHORUS: 3.5 mg/dL (ref 2.5–4.5)

## 2016-06-24 LAB — AMYLASE: AMYLASE: 41 U/L (ref 0–105)

## 2016-06-24 NOTE — Progress Notes (Signed)
Study: A Phase 2b/3 Double Blind Safety and Efficacy Study of Injectable Cabotegravir compared to Daily Oral Tenofovir Disoproxil Fumarate/Emtricitabine (TDF/FTC), For Pre-Exposure Prophylaxis in HIV-Uninfected Cisgender Men and Transgender Women who have sex with Men.  Medication: Investigational Injectable Cabotegravir/placebo compared to Truvada/placebo. Duration: Around 4 years.  Glenn Rivera is here for week 19. He denies any injection site reaction and any other issues. Blood drawn and HIV rapid confirmed non-reactive. He returned #48 oral study drug at this visit. Questionnaires completed. Next appointment scheduled for 5/14 @ 830.

## 2016-08-05 ENCOUNTER — Encounter (INDEPENDENT_AMBULATORY_CARE_PROVIDER_SITE_OTHER): Payer: BLUE CROSS/BLUE SHIELD | Admitting: *Deleted

## 2016-08-05 VITALS — BP 124/84 | HR 76 | Temp 97.8°F | Wt 199.2 lb

## 2016-08-05 DIAGNOSIS — Z006 Encounter for examination for normal comparison and control in clinical research program: Secondary | ICD-10-CM

## 2016-08-05 LAB — CBC WITH DIFFERENTIAL/PLATELET
Basophils Absolute: 0 cells/uL (ref 0–200)
Basophils Relative: 0 %
EOS PCT: 6 %
Eosinophils Absolute: 210 cells/uL (ref 15–500)
HCT: 45.4 % (ref 38.5–50.0)
Hemoglobin: 14.8 g/dL (ref 13.2–17.1)
LYMPHS PCT: 24 %
Lymphs Abs: 840 cells/uL — ABNORMAL LOW (ref 850–3900)
MCH: 29.8 pg (ref 27.0–33.0)
MCHC: 32.6 g/dL (ref 32.0–36.0)
MCV: 91.5 fL (ref 80.0–100.0)
MPV: 11.2 fL (ref 7.5–12.5)
Monocytes Absolute: 420 cells/uL (ref 200–950)
Monocytes Relative: 12 %
NEUTROS PCT: 58 %
Neutro Abs: 2030 cells/uL (ref 1500–7800)
Platelets: 230 10*3/uL (ref 140–400)
RBC: 4.96 MIL/uL (ref 4.20–5.80)
RDW: 13.9 % (ref 11.0–15.0)
WBC: 3.5 10*3/uL — AB (ref 3.8–10.8)

## 2016-08-05 NOTE — Progress Notes (Signed)
Glenn Rivera is here for his week 25 visit for Study: A Phase 2b/3 Double Blind Safety and Efficacy Study of Injectable Cabotegravir compared to Daily Oral Tenofovir Disoproxil Fumarate/Emtricitabine (TDF/FTC), For Pre-Exposure Prophylaxis in HIV-Uninfected Cisgender Men and Transgender Women who have sex with Men.  Medication: Investigational Injectable Cabotegravir/placebo compared to Truvada/placebo. Duration: Around 4 years.  He says he has been mostly adherent, may have missed a few due to scheduling, etc. By pill counts, missed 4 doses over past 8 weeks. He started using Flonase OTC for "hayfever" symptoms. He was also diagnosed and treated for gonorrhea a few weeks ago. I [facility]. He just graduated from his [employer] program and starts a new job today at [employer]. He is interested in participating on the CAB and will start soon. His next appt. Is May 29th for followup.

## 2016-08-06 LAB — COMPREHENSIVE METABOLIC PANEL
ALBUMIN: 4.1 g/dL (ref 3.6–5.1)
ALT: 57 U/L — ABNORMAL HIGH (ref 9–46)
AST: 38 U/L (ref 10–40)
Alkaline Phosphatase: 52 U/L (ref 40–115)
BILIRUBIN TOTAL: 0.6 mg/dL (ref 0.2–1.2)
BUN: 10 mg/dL (ref 7–25)
CO2: 25 mmol/L (ref 20–31)
CREATININE: 1.22 mg/dL (ref 0.60–1.35)
Calcium: 9.2 mg/dL (ref 8.6–10.3)
Chloride: 104 mmol/L (ref 98–110)
Glucose, Bld: 87 mg/dL (ref 65–99)
Potassium: 4 mmol/L (ref 3.5–5.3)
SODIUM: 139 mmol/L (ref 135–146)
TOTAL PROTEIN: 6.6 g/dL (ref 6.1–8.1)

## 2016-08-06 LAB — CK: CK TOTAL: 615 U/L — AB (ref 44–196)

## 2016-08-06 LAB — PHOSPHORUS: Phosphorus: 3.2 mg/dL (ref 2.5–4.5)

## 2016-08-06 LAB — AMYLASE: AMYLASE: 45 U/L (ref 21–101)

## 2016-08-06 LAB — LIPASE: Lipase: 24 U/L (ref 7–60)

## 2016-08-06 LAB — HIV ANTIBODY (ROUTINE TESTING W REFLEX): HIV: NONREACTIVE

## 2016-08-20 ENCOUNTER — Encounter (INDEPENDENT_AMBULATORY_CARE_PROVIDER_SITE_OTHER): Payer: Self-pay | Admitting: *Deleted

## 2016-08-20 VITALS — BP 121/73 | HR 91 | Temp 97.3°F | Wt 196.0 lb

## 2016-08-20 DIAGNOSIS — Z006 Encounter for examination for normal comparison and control in clinical research program: Secondary | ICD-10-CM

## 2016-08-20 LAB — CBC WITH DIFFERENTIAL/PLATELET
BASOS ABS: 37 {cells}/uL (ref 0–200)
Basophils Relative: 1 %
Eosinophils Absolute: 111 cells/uL (ref 15–500)
Eosinophils Relative: 3 %
HEMATOCRIT: 47.1 % (ref 38.5–50.0)
HEMOGLOBIN: 15.4 g/dL (ref 13.2–17.1)
LYMPHS PCT: 39 %
Lymphs Abs: 1443 cells/uL (ref 850–3900)
MCH: 29.5 pg (ref 27.0–33.0)
MCHC: 32.7 g/dL (ref 32.0–36.0)
MCV: 90.2 fL (ref 80.0–100.0)
MONO ABS: 333 {cells}/uL (ref 200–950)
MPV: 11 fL (ref 7.5–12.5)
Monocytes Relative: 9 %
NEUTROS PCT: 48 %
Neutro Abs: 1776 cells/uL (ref 1500–7800)
Platelets: 262 10*3/uL (ref 140–400)
RBC: 5.22 MIL/uL (ref 4.20–5.80)
RDW: 13.8 % (ref 11.0–15.0)
WBC: 3.7 10*3/uL — AB (ref 3.8–10.8)

## 2016-08-20 LAB — COMPREHENSIVE METABOLIC PANEL
ALBUMIN: 4.2 g/dL (ref 3.6–5.1)
ALT: 47 U/L — ABNORMAL HIGH (ref 9–46)
AST: 28 U/L (ref 10–40)
Alkaline Phosphatase: 57 U/L (ref 40–115)
BUN: 11 mg/dL (ref 7–25)
CALCIUM: 9.7 mg/dL (ref 8.6–10.3)
CO2: 27 mmol/L (ref 20–31)
Chloride: 105 mmol/L (ref 98–110)
Creat: 1.13 mg/dL (ref 0.60–1.35)
GLUCOSE: 88 mg/dL (ref 65–99)
POTASSIUM: 4.1 mmol/L (ref 3.5–5.3)
Sodium: 140 mmol/L (ref 135–146)
Total Bilirubin: 0.6 mg/dL (ref 0.2–1.2)
Total Protein: 6.8 g/dL (ref 6.1–8.1)

## 2016-08-20 LAB — AMYLASE: Amylase: 49 U/L (ref 21–101)

## 2016-08-20 LAB — CK: CK TOTAL: 319 U/L — AB (ref 44–196)

## 2016-08-20 LAB — HIV ANTIBODY (ROUTINE TESTING W REFLEX): HIV 1&2 Ab, 4th Generation: NONREACTIVE

## 2016-08-20 LAB — LIPASE: LIPASE: 32 U/L (ref 7–60)

## 2016-08-20 LAB — PHOSPHORUS: PHOSPHORUS: 3.4 mg/dL (ref 2.5–4.5)

## 2016-08-20 NOTE — Progress Notes (Unsigned)
Study: A Phase 2b/3 Double Blind Safety and Efficacy Study of Injectable Cabotegravir compared to Daily Oral Tenofovir Disoproxil Fumarate/Emtricitabine (TDF/FTC), For Pre-Exposure Prophylaxis in HIV-Uninfected Cisgender Men and Transgender Women who have sex with Men.  Medication: Investigational Injectable Cabotegravir/placebo compared to Truvada/placebo. Duration: Around 4 years.  Reuel BoomDaniel is here for week 27 visit. States only mild tenderness at injection site which lasted a day. No other complaint or concerns verbalized. States that he has been very adherent with his oral study medication. Rapid HIV non-reactive. Condoms and lube provided. Next injection visit scheduled for 10/02/16 @ 8:00am.

## 2016-10-02 ENCOUNTER — Encounter (INDEPENDENT_AMBULATORY_CARE_PROVIDER_SITE_OTHER): Payer: BLUE CROSS/BLUE SHIELD | Admitting: *Deleted

## 2016-10-02 VITALS — BP 116/75 | HR 76 | Temp 98.0°F | Wt 198.0 lb

## 2016-10-02 DIAGNOSIS — Z006 Encounter for examination for normal comparison and control in clinical research program: Secondary | ICD-10-CM

## 2016-10-02 LAB — CBC WITH DIFFERENTIAL/PLATELET
Basophils Absolute: 35 {cells}/uL (ref 0–200)
Basophils Relative: 1 %
Eosinophils Absolute: 105 {cells}/uL (ref 15–500)
Eosinophils Relative: 3 %
HCT: 47 % (ref 38.5–50.0)
Hemoglobin: 15.6 g/dL (ref 13.2–17.1)
Lymphocytes Relative: 35 %
Lymphs Abs: 1225 {cells}/uL (ref 850–3900)
MCH: 30.2 pg (ref 27.0–33.0)
MCHC: 33.2 g/dL (ref 32.0–36.0)
MCV: 91.1 fL (ref 80.0–100.0)
MPV: 11.3 fL (ref 7.5–12.5)
Monocytes Absolute: 350 {cells}/uL (ref 200–950)
Monocytes Relative: 10 %
Neutro Abs: 1785 {cells}/uL (ref 1500–7800)
Neutrophils Relative %: 51 %
Platelets: 230 10*3/uL (ref 140–400)
RBC: 5.16 MIL/uL (ref 4.20–5.80)
RDW: 14.1 % (ref 11.0–15.0)
WBC: 3.5 10*3/uL — ABNORMAL LOW (ref 3.8–10.8)

## 2016-10-02 LAB — COMPREHENSIVE METABOLIC PANEL WITH GFR
ALT: 31 U/L (ref 9–46)
AST: 25 U/L (ref 10–40)
Albumin: 4.1 g/dL (ref 3.6–5.1)
Alkaline Phosphatase: 63 U/L (ref 40–115)
BUN: 14 mg/dL (ref 7–25)
CO2: 23 mmol/L (ref 20–31)
Calcium: 9.3 mg/dL (ref 8.6–10.3)
Chloride: 103 mmol/L (ref 98–110)
Creat: 1.17 mg/dL (ref 0.60–1.35)
Glucose, Bld: 91 mg/dL (ref 65–99)
Potassium: 4.1 mmol/L (ref 3.5–5.3)
Sodium: 137 mmol/L (ref 135–146)
Total Bilirubin: 0.5 mg/dL (ref 0.2–1.2)
Total Protein: 6.7 g/dL (ref 6.1–8.1)

## 2016-10-02 LAB — PHOSPHORUS: PHOSPHORUS: 3.8 mg/dL (ref 2.5–4.5)

## 2016-10-02 NOTE — Progress Notes (Signed)
Study: A Phase 2b/3 Double Blind Safety and Efficacy Study of Injectable Cabotegravir compared to Daily Oral Tenofovir Disoproxil Fumarate/Emtricitabine (TDF/FTC), For Pre-Exposure Prophylaxis in HIV-Uninfected Cisgender Men and Transgender Women who have sex with Men.  Medication: Investigational Injectable Cabotegravir/placebo compared to Truvada/placebo. Duration: Around 4 years.  Glenn Rivera is here for week 33. He did not return study products. He denies any new problems or medications. Assessment unchanged since last study visit. HIV rapid confirmed non-reactive. Questionnaires completed. Oral study product dispensed. He received injection in left gluteal muscle with no problem. HE received $50 gift card for visit. Next appointment scheduled for 7/23.

## 2016-10-02 NOTE — Progress Notes (Signed)
S/w ppt in exam room; ppt would like a text reminder for appts.  Confirmed contact phone.  Ppt gave permission to send mail; confirmed mailing address. Ppt has active mychart as well as the [employer] app. Ppt has dairy allergy and must not be given snack with lactose.

## 2016-10-03 LAB — AMYLASE: AMYLASE: 46 U/L (ref 21–101)

## 2016-10-03 LAB — GC/CHLAMYDIA PROBE AMP
CT Probe RNA: NOT DETECTED
GC PROBE AMP APTIMA: NOT DETECTED

## 2016-10-03 LAB — LIPASE: Lipase: 42 U/L (ref 7–60)

## 2016-10-03 LAB — RPR

## 2016-10-03 LAB — HIV ANTIBODY (ROUTINE TESTING W REFLEX): HIV: NONREACTIVE

## 2016-10-03 LAB — CK: Total CK: 188 U/L (ref 44–196)

## 2016-10-05 LAB — CT/NG RNA, TMA RECTAL
Chlamydia Trachomatis RNA: NOT DETECTED
Neisseria Gonorrhoeae RNA: NOT DETECTED

## 2016-10-15 ENCOUNTER — Encounter (INDEPENDENT_AMBULATORY_CARE_PROVIDER_SITE_OTHER): Payer: BLUE CROSS/BLUE SHIELD | Admitting: *Deleted

## 2016-10-15 VITALS — BP 127/80 | HR 73 | Temp 98.3°F | Wt 200.0 lb

## 2016-10-15 DIAGNOSIS — Z006 Encounter for examination for normal comparison and control in clinical research program: Secondary | ICD-10-CM

## 2016-10-15 LAB — CBC WITH DIFFERENTIAL/PLATELET
BASOS ABS: 0 {cells}/uL (ref 0–200)
Basophils Relative: 0 %
EOS ABS: 99 {cells}/uL (ref 15–500)
Eosinophils Relative: 3 %
HEMATOCRIT: 47.7 % (ref 38.5–50.0)
Hemoglobin: 16 g/dL (ref 13.2–17.1)
LYMPHS PCT: 39 %
Lymphs Abs: 1287 cells/uL (ref 850–3900)
MCH: 30.2 pg (ref 27.0–33.0)
MCHC: 33.5 g/dL (ref 32.0–36.0)
MCV: 90.2 fL (ref 80.0–100.0)
MONO ABS: 396 {cells}/uL (ref 200–950)
MONOS PCT: 12 %
MPV: 11.5 fL (ref 7.5–12.5)
Neutro Abs: 1518 cells/uL (ref 1500–7800)
Neutrophils Relative %: 46 %
PLATELETS: 225 10*3/uL (ref 140–400)
RBC: 5.29 MIL/uL (ref 4.20–5.80)
RDW: 13.7 % (ref 11.0–15.0)
WBC: 3.3 10*3/uL — ABNORMAL LOW (ref 3.8–10.8)

## 2016-10-15 NOTE — Progress Notes (Addendum)
Glenn Rivera is here for his week 35 visit for HPTN. He denies any new problems or concerns. He said he did notice a small knot at his injection site, but it didn't hurt and only lasted a day or two.  He will return in Sept. For the next visit.

## 2016-10-16 LAB — COMPREHENSIVE METABOLIC PANEL
ALK PHOS: 60 U/L (ref 40–115)
ALT: 33 U/L (ref 9–46)
AST: 28 U/L (ref 10–40)
Albumin: 4.1 g/dL (ref 3.6–5.1)
BUN: 12 mg/dL (ref 7–25)
CALCIUM: 9.6 mg/dL (ref 8.6–10.3)
CHLORIDE: 103 mmol/L (ref 98–110)
CO2: 20 mmol/L (ref 20–31)
Creat: 1.17 mg/dL (ref 0.60–1.35)
Glucose, Bld: 86 mg/dL (ref 65–99)
POTASSIUM: 4.6 mmol/L (ref 3.5–5.3)
Sodium: 139 mmol/L (ref 135–146)
TOTAL PROTEIN: 6.7 g/dL (ref 6.1–8.1)
Total Bilirubin: 0.4 mg/dL (ref 0.2–1.2)

## 2016-10-16 LAB — AMYLASE: Amylase: 54 U/L (ref 21–101)

## 2016-10-16 LAB — LIPASE: LIPASE: 35 U/L (ref 7–60)

## 2016-10-16 LAB — PHOSPHORUS: PHOSPHORUS: 3.4 mg/dL (ref 2.5–4.5)

## 2016-10-16 LAB — HIV ANTIBODY (ROUTINE TESTING W REFLEX): HIV 1&2 Ab, 4th Generation: NONREACTIVE

## 2016-10-16 LAB — CK: CK TOTAL: 266 U/L — AB (ref 44–196)

## 2016-11-26 ENCOUNTER — Encounter: Payer: BLUE CROSS/BLUE SHIELD | Admitting: *Deleted

## 2016-11-26 ENCOUNTER — Encounter (INDEPENDENT_AMBULATORY_CARE_PROVIDER_SITE_OTHER): Payer: Self-pay | Admitting: *Deleted

## 2016-11-26 VITALS — BP 121/77 | HR 78 | Temp 97.7°F | Wt 203.5 lb

## 2016-11-26 DIAGNOSIS — Z006 Encounter for examination for normal comparison and control in clinical research program: Secondary | ICD-10-CM

## 2016-11-26 NOTE — Progress Notes (Signed)
Study: A Phase 2b/3 Double Blind Safety and Efficacy Study of Injectable Cabotegravir compared to Daily Oral Tenofovir Disoproxil Fumarate/Emtricitabine (TDF/FTC), For Pre-Exposure Prophylaxis in HIV-Uninfected Cisgender Men and Transgender Women who have sex with Men.  Medication: Investigational Injectable Cabotegravir/placebo compared to Truvada/placebo. Duration: Around 4 years.  Reuel BoomDaniel is here for week 41 visit. Verbalized increase stress related to some family issues over the past week. Emotional support was provided. He did meet with Clarisse GougeBridget today and was provided information on counseling resources.  States that he has been adherent with his oral study medication. Rapid HIV non-reactive. Cabotegravir/placebo injection given (L) gluteal muscle. Site unremarkable. Next visit scheduled for 12/09/16 at 8:30am.

## 2016-11-27 LAB — COMPREHENSIVE METABOLIC PANEL
ALT: 30 U/L (ref 9–46)
AST: 33 U/L (ref 10–40)
Albumin: 4.3 g/dL (ref 3.6–5.1)
Alkaline Phosphatase: 65 U/L (ref 40–115)
BUN: 11 mg/dL (ref 7–25)
CHLORIDE: 104 mmol/L (ref 98–110)
CO2: 26 mmol/L (ref 20–32)
CREATININE: 1.1 mg/dL (ref 0.60–1.35)
Calcium: 9.2 mg/dL (ref 8.6–10.3)
GLUCOSE: 77 mg/dL (ref 65–99)
POTASSIUM: 4.5 mmol/L (ref 3.5–5.3)
SODIUM: 140 mmol/L (ref 135–146)
TOTAL PROTEIN: 6.8 g/dL (ref 6.1–8.1)
Total Bilirubin: 0.4 mg/dL (ref 0.2–1.2)

## 2016-11-27 LAB — CBC WITH DIFFERENTIAL/PLATELET
BASOS PCT: 1 %
Basophils Absolute: 34 cells/uL (ref 0–200)
Eosinophils Absolute: 34 cells/uL (ref 15–500)
Eosinophils Relative: 1 %
HCT: 48.8 % (ref 38.5–50.0)
Hemoglobin: 15.8 g/dL (ref 13.2–17.1)
LYMPHS PCT: 44 %
Lymphs Abs: 1496 cells/uL (ref 850–3900)
MCH: 29.9 pg (ref 27.0–33.0)
MCHC: 32.4 g/dL (ref 32.0–36.0)
MCV: 92.4 fL (ref 80.0–100.0)
MONOS PCT: 7 %
MPV: 11.2 fL (ref 7.5–12.5)
Monocytes Absolute: 238 cells/uL (ref 200–950)
NEUTROS ABS: 1598 {cells}/uL (ref 1500–7800)
Neutrophils Relative %: 47 %
PLATELETS: 250 10*3/uL (ref 140–400)
RBC: 5.28 MIL/uL (ref 4.20–5.80)
RDW: 13.8 % (ref 11.0–15.0)
WBC: 3.4 10*3/uL — ABNORMAL LOW (ref 3.8–10.8)

## 2016-11-27 LAB — CK: CK TOTAL: 1044 U/L — AB (ref 44–196)

## 2016-11-27 LAB — PHOSPHORUS: Phosphorus: 4 mg/dL (ref 2.5–4.5)

## 2016-11-27 LAB — HIV ANTIBODY (ROUTINE TESTING W REFLEX): HIV: NONREACTIVE

## 2016-11-27 LAB — LIPASE: Lipase: 39 U/L (ref 7–60)

## 2016-11-27 LAB — AMYLASE: AMYLASE: 41 U/L (ref 21–101)

## 2016-12-09 ENCOUNTER — Encounter (INDEPENDENT_AMBULATORY_CARE_PROVIDER_SITE_OTHER): Payer: Self-pay | Admitting: *Deleted

## 2016-12-09 VITALS — BP 125/79 | HR 84 | Temp 98.3°F | Wt 202.0 lb

## 2016-12-09 DIAGNOSIS — Z006 Encounter for examination for normal comparison and control in clinical research program: Secondary | ICD-10-CM

## 2016-12-09 NOTE — Progress Notes (Signed)
Study: A Phase 2b/3 Double Blind Safety and Efficacy Study of Injectable Cabotegravir compared to Daily Oral Tenofovir Disoproxil Fumarate/Emtricitabine (TDF/FTC), For Pre-Exposure Prophylaxis in HIV-Uninfected Cisgender Men and Transgender Women who have sex with Men.  Medication: Investigational Injectable Cabotegravir/placebo compared to Truvada/placebo. Duration: Around 4 years.  Glenn Rivera is here for week 43. After verifying the correct version (2.0) I explained/reviewed the informed consent in the language that he understood. Risk, benefits, responsibilities, and other options were reviewed. I answered his questions. Comprehension was assessed. He was given adequate time to consider his options. He verbalized understanding and signed the consent witnessed by me. I then gave him a copy of the consent. He denies any new issues and no injection site reaction. Assessment unchanged since last visit. HIV rapid confirmed non-reactive. He received $50 gift card for visit. Next appointment scheduled for 10/29.

## 2016-12-10 LAB — COMPREHENSIVE METABOLIC PANEL
AG RATIO: 1.7 (calc) (ref 1.0–2.5)
ALBUMIN MSPROF: 4.4 g/dL (ref 3.6–5.1)
ALT: 22 U/L (ref 9–46)
AST: 22 U/L (ref 10–40)
Alkaline phosphatase (APISO): 61 U/L (ref 40–115)
BILIRUBIN TOTAL: 0.5 mg/dL (ref 0.2–1.2)
BUN: 9 mg/dL (ref 7–25)
CALCIUM: 9.6 mg/dL (ref 8.6–10.3)
CHLORIDE: 102 mmol/L (ref 98–110)
CO2: 32 mmol/L (ref 20–32)
Creat: 1.12 mg/dL (ref 0.60–1.35)
GLOBULIN: 2.6 g/dL (ref 1.9–3.7)
Glucose, Bld: 72 mg/dL (ref 65–99)
POTASSIUM: 4.4 mmol/L (ref 3.5–5.3)
SODIUM: 138 mmol/L (ref 135–146)
TOTAL PROTEIN: 7 g/dL (ref 6.1–8.1)

## 2016-12-10 LAB — CBC WITH DIFFERENTIAL/PLATELET
BASOS PCT: 0.6 %
Basophils Absolute: 20 cells/uL (ref 0–200)
EOS ABS: 31 {cells}/uL (ref 15–500)
Eosinophils Relative: 0.9 %
HEMATOCRIT: 50.5 % — AB (ref 38.5–50.0)
HEMOGLOBIN: 16.6 g/dL (ref 13.2–17.1)
LYMPHS ABS: 1051 {cells}/uL (ref 850–3900)
MCH: 29.6 pg (ref 27.0–33.0)
MCHC: 32.9 g/dL (ref 32.0–36.0)
MCV: 90 fL (ref 80.0–100.0)
MPV: 11.7 fL (ref 7.5–12.5)
Monocytes Relative: 4.7 %
NEUTROS ABS: 2139 {cells}/uL (ref 1500–7800)
Neutrophils Relative %: 62.9 %
Platelets: 250 10*3/uL (ref 140–400)
RBC: 5.61 10*6/uL (ref 4.20–5.80)
RDW: 12.3 % (ref 11.0–15.0)
TOTAL LYMPHOCYTE: 30.9 %
WBC: 3.4 10*3/uL — ABNORMAL LOW (ref 3.8–10.8)
WBCMIX: 160 {cells}/uL — AB (ref 200–950)

## 2016-12-10 LAB — AMYLASE: Amylase: 48 U/L (ref 21–101)

## 2016-12-10 LAB — LIPASE: LIPASE: 22 U/L (ref 7–60)

## 2016-12-10 LAB — HIV ANTIBODY (ROUTINE TESTING W REFLEX): HIV: NONREACTIVE

## 2016-12-10 LAB — PHOSPHORUS: Phosphorus: 3.1 mg/dL (ref 2.5–4.5)

## 2016-12-10 LAB — CK: Total CK: 174 U/L (ref 44–196)

## 2017-01-20 ENCOUNTER — Encounter (INDEPENDENT_AMBULATORY_CARE_PROVIDER_SITE_OTHER): Payer: Self-pay | Admitting: *Deleted

## 2017-01-20 VITALS — BP 128/79 | HR 73 | Temp 97.5°F | Wt 206.5 lb

## 2017-01-20 DIAGNOSIS — Z006 Encounter for examination for normal comparison and control in clinical research program: Secondary | ICD-10-CM

## 2017-01-20 NOTE — Progress Notes (Signed)
Study: A Phase 2b/3 Double Blind Safety and Efficacy Study of Injectable Cabotegravir compared to Daily Oral Tenofovir Disoproxil Fumarate/Emtricitabine (TDF/FTC), For Pre-Exposure Prophylaxis in HIV-Uninfected Cisgender Men and Transgender Women who have sex with Men.  Medication: Investigational Injectable Cabotegravir/placebo compared to Truvada/placebo. Duration: Around 4 years.  Reuel BoomDaniel is here for week 49 visit. No new complaint or concerns. Excited about starting a new job at [employer]. Will start this Thursday. States that he has been mostly adherent, may have missed a few doses. Returned #38, by pill count missed 4 doses over past 8 weeks. Rapid HIV non-reactive. Cabotegravir/placebo injection given (L) gluteal muscle. Site unremarkable. Next study visit scheduled for 02/04/17 at 8:30am.

## 2017-01-21 LAB — CBC WITH DIFFERENTIAL/PLATELET
BASOS ABS: 20 {cells}/uL (ref 0–200)
BASOS PCT: 0.5 %
EOS ABS: 120 {cells}/uL (ref 15–500)
EOS PCT: 3 %
HEMATOCRIT: 45.9 % (ref 38.5–50.0)
Hemoglobin: 15.7 g/dL (ref 13.2–17.1)
LYMPHS ABS: 1292 {cells}/uL (ref 850–3900)
MCH: 30.3 pg (ref 27.0–33.0)
MCHC: 34.2 g/dL (ref 32.0–36.0)
MCV: 88.4 fL (ref 80.0–100.0)
MPV: 11.9 fL (ref 7.5–12.5)
Monocytes Relative: 7.3 %
NEUTROS PCT: 56.9 %
Neutro Abs: 2276 cells/uL (ref 1500–7800)
Platelets: 205 10*3/uL (ref 140–400)
RBC: 5.19 10*6/uL (ref 4.20–5.80)
RDW: 12.4 % (ref 11.0–15.0)
Total Lymphocyte: 32.3 %
WBC: 4 10*3/uL (ref 3.8–10.8)
WBCMIX: 292 {cells}/uL (ref 200–950)

## 2017-01-21 LAB — COMPREHENSIVE METABOLIC PANEL
AG Ratio: 1.5 (calc) (ref 1.0–2.5)
ALBUMIN MSPROF: 4 g/dL (ref 3.6–5.1)
ALT: 32 U/L (ref 9–46)
AST: 22 U/L (ref 10–40)
Alkaline phosphatase (APISO): 63 U/L (ref 40–115)
BILIRUBIN TOTAL: 0.5 mg/dL (ref 0.2–1.2)
BUN: 13 mg/dL (ref 7–25)
CALCIUM: 9.3 mg/dL (ref 8.6–10.3)
CHLORIDE: 101 mmol/L (ref 98–110)
CO2: 31 mmol/L (ref 20–32)
CREATININE: 1.13 mg/dL (ref 0.60–1.35)
GLOBULIN: 2.6 g/dL (ref 1.9–3.7)
Glucose, Bld: 119 mg/dL — ABNORMAL HIGH (ref 65–99)
POTASSIUM: 4.1 mmol/L (ref 3.5–5.3)
SODIUM: 139 mmol/L (ref 135–146)
TOTAL PROTEIN: 6.6 g/dL (ref 6.1–8.1)

## 2017-01-21 LAB — PHOSPHORUS: PHOSPHORUS: 3.4 mg/dL (ref 2.5–4.5)

## 2017-01-21 LAB — AMYLASE: Amylase: 44 U/L (ref 21–101)

## 2017-01-21 LAB — CK: CK TOTAL: 186 U/L (ref 44–196)

## 2017-01-21 LAB — LIPASE: Lipase: 32 U/L (ref 7–60)

## 2017-01-21 LAB — HIV ANTIBODY (ROUTINE TESTING W REFLEX): HIV: NONREACTIVE

## 2017-02-04 ENCOUNTER — Encounter (INDEPENDENT_AMBULATORY_CARE_PROVIDER_SITE_OTHER): Payer: Self-pay | Admitting: *Deleted

## 2017-02-04 VITALS — BP 117/77 | HR 77 | Temp 97.3°F | Wt 203.8 lb

## 2017-02-04 DIAGNOSIS — Z006 Encounter for examination for normal comparison and control in clinical research program: Secondary | ICD-10-CM

## 2017-02-04 NOTE — Progress Notes (Signed)
S/w ppt in exam room to verify contact phone number. Ppt changed number; updated in chart. Reminded ppt about CAB on 02/05/17; ppt stated that he can attend. Referred p[facility] to [facility] on clinic side for counseling if needed.

## 2017-02-04 NOTE — Progress Notes (Signed)
Study: A Phase 2b/3 Double Blind Safety and Efficacy Study of Injectable Cabotegravir compared to Daily Oral Tenofovir Disoproxil Fumarate/Emtricitabine (TDF/FTC), For Pre-Exposure Prophylaxis in HIV-Uninfected Cisgender Men and Transgender Women who have sex with Men.  Medication: Investigational Injectable Cabotegravir/placebo compared to Truvada/placebo. Duration: Around 4 years.  Glenn Rivera is here for week 51 visit. Denies any problems with his last injection. No new problems or concerns. States good adherence with his oral study medication. Rapid HIV non-reactive. Next visit scheduled for 12/20 at 8:30am.

## 2017-02-05 LAB — CBC WITH DIFFERENTIAL/PLATELET
BASOS ABS: 20 {cells}/uL (ref 0–200)
Basophils Relative: 0.5 %
EOS ABS: 80 {cells}/uL (ref 15–500)
Eosinophils Relative: 2 %
HCT: 48.4 % (ref 38.5–50.0)
HEMOGLOBIN: 16.3 g/dL (ref 13.2–17.1)
Lymphs Abs: 1144 cells/uL (ref 850–3900)
MCH: 29.9 pg (ref 27.0–33.0)
MCHC: 33.7 g/dL (ref 32.0–36.0)
MCV: 88.8 fL (ref 80.0–100.0)
MONOS PCT: 9.5 %
MPV: 11.9 fL (ref 7.5–12.5)
NEUTROS ABS: 2376 {cells}/uL (ref 1500–7800)
Neutrophils Relative %: 59.4 %
Platelets: 217 10*3/uL (ref 140–400)
RBC: 5.45 10*6/uL (ref 4.20–5.80)
RDW: 12.2 % (ref 11.0–15.0)
Total Lymphocyte: 28.6 %
WBC: 4 10*3/uL (ref 3.8–10.8)
WBCMIX: 380 {cells}/uL (ref 200–950)

## 2017-02-05 LAB — COMPREHENSIVE METABOLIC PANEL
AG RATIO: 1.5 (calc) (ref 1.0–2.5)
ALKALINE PHOSPHATASE (APISO): 69 U/L (ref 40–115)
ALT: 43 U/L (ref 9–46)
AST: 24 U/L (ref 10–40)
Albumin: 4.2 g/dL (ref 3.6–5.1)
BILIRUBIN TOTAL: 0.4 mg/dL (ref 0.2–1.2)
BUN: 14 mg/dL (ref 7–25)
CALCIUM: 9.5 mg/dL (ref 8.6–10.3)
CHLORIDE: 102 mmol/L (ref 98–110)
CO2: 31 mmol/L (ref 20–32)
Creat: 1.2 mg/dL (ref 0.60–1.35)
GLUCOSE: 71 mg/dL (ref 65–99)
Globulin: 2.8 g/dL (calc) (ref 1.9–3.7)
Potassium: 4.2 mmol/L (ref 3.5–5.3)
Sodium: 139 mmol/L (ref 135–146)
Total Protein: 7 g/dL (ref 6.1–8.1)

## 2017-02-05 LAB — LIPASE: LIPASE: 46 U/L (ref 7–60)

## 2017-02-05 LAB — AMYLASE: Amylase: 48 U/L (ref 21–101)

## 2017-02-05 LAB — CK: Total CK: 159 U/L (ref 44–196)

## 2017-02-05 LAB — PHOSPHORUS: PHOSPHORUS: 3.2 mg/dL (ref 2.5–4.5)

## 2017-02-05 LAB — HIV ANTIBODY (ROUTINE TESTING W REFLEX): HIV 1&2 Ab, 4th Generation: NONREACTIVE

## 2017-03-13 ENCOUNTER — Encounter (INDEPENDENT_AMBULATORY_CARE_PROVIDER_SITE_OTHER): Payer: Self-pay | Admitting: *Deleted

## 2017-03-13 VITALS — BP 135/78 | HR 80 | Temp 97.5°F | Wt 200.8 lb

## 2017-03-13 DIAGNOSIS — Z006 Encounter for examination for normal comparison and control in clinical research program: Secondary | ICD-10-CM

## 2017-03-13 NOTE — Progress Notes (Signed)
Study: A Phase 2b/3 Double Blind Safety and Efficacy Study of Injectable Cabotegravir compared to Daily Oral Tenofovir Disoproxil Fumarate/Emtricitabine (TDF/FTC), For Pre-Exposure Prophylaxis in HIV-Uninfected Cisgender Men and Transgender Women who have sex with Men.  Medication: Investigational Injectable Cabotegravir/placebo compared to Truvada/placebo. Duration: Around 4 years.  Reuel BoomDaniel is here for week 57 visit. No new complaints or concerns. 100% compliance from pill count with oral study medication. Rapid HIV non-reactive. Cabotegravir/placebo injection given (L) glute. Site unremarkable. Next visit scheduled for 04/02/17 at 8:30am.

## 2017-03-14 LAB — HEPATITIS C ANTIBODY
Hepatitis C Ab: NONREACTIVE
SIGNAL TO CUT-OFF: 0.01 (ref <1.00)

## 2017-03-14 LAB — URINALYSIS, ROUTINE W REFLEX MICROSCOPIC
BACTERIA UA: NONE SEEN /HPF
BILIRUBIN URINE: NEGATIVE
Glucose, UA: NEGATIVE
HGB URINE DIPSTICK: NEGATIVE
Hyaline Cast: NONE SEEN /LPF
KETONES UR: NEGATIVE
Nitrite: NEGATIVE
PROTEIN: NEGATIVE
RBC / HPF: NONE SEEN /HPF (ref 0–2)
SQUAMOUS EPITHELIAL / LPF: NONE SEEN /HPF (ref <=5)
Specific Gravity, Urine: 1.017 (ref 1.001–1.03)
pH: 5.5 (ref 5.0–8.0)

## 2017-03-14 LAB — COMPREHENSIVE METABOLIC PANEL
AG Ratio: 1.5 (calc) (ref 1.0–2.5)
ALT: 73 U/L — AB (ref 9–46)
AST: 37 U/L (ref 10–40)
Albumin: 4.2 g/dL (ref 3.6–5.1)
Alkaline phosphatase (APISO): 60 U/L (ref 40–115)
BUN: 13 mg/dL (ref 7–25)
CO2: 28 mmol/L (ref 20–32)
Calcium: 9.8 mg/dL (ref 8.6–10.3)
Chloride: 102 mmol/L (ref 98–110)
Creat: 1.15 mg/dL (ref 0.60–1.35)
GLUCOSE: 84 mg/dL (ref 65–99)
Globulin: 2.8 g/dL (calc) (ref 1.9–3.7)
Potassium: 4.4 mmol/L (ref 3.5–5.3)
SODIUM: 138 mmol/L (ref 135–146)
TOTAL PROTEIN: 7 g/dL (ref 6.1–8.1)
Total Bilirubin: 0.4 mg/dL (ref 0.2–1.2)

## 2017-03-14 LAB — CBC WITH DIFFERENTIAL/PLATELET
BASOS ABS: 28 {cells}/uL (ref 0–200)
Basophils Relative: 0.6 %
EOS PCT: 0.8 %
Eosinophils Absolute: 38 cells/uL (ref 15–500)
HCT: 47.3 % (ref 38.5–50.0)
Hemoglobin: 16.3 g/dL (ref 13.2–17.1)
Lymphs Abs: 1109 cells/uL (ref 850–3900)
MCH: 30.6 pg (ref 27.0–33.0)
MCHC: 34.5 g/dL (ref 32.0–36.0)
MCV: 88.9 fL (ref 80.0–100.0)
MONOS PCT: 7.9 %
MPV: 11.7 fL (ref 7.5–12.5)
NEUTROS ABS: 3154 {cells}/uL (ref 1500–7800)
NEUTROS PCT: 67.1 %
PLATELETS: 251 10*3/uL (ref 140–400)
RBC: 5.32 10*6/uL (ref 4.20–5.80)
RDW: 13 % (ref 11.0–15.0)
TOTAL LYMPHOCYTE: 23.6 %
WBC mixed population: 371 cells/uL (ref 200–950)
WBC: 4.7 10*3/uL (ref 3.8–10.8)

## 2017-03-14 LAB — LIPID PANEL
Cholesterol: 158 mg/dL (ref <200)
HDL: 76 mg/dL (ref >40)
LDL Cholesterol (Calc): 71 mg/dL (calc)
Non-HDL Cholesterol (Calc): 82 mg/dL (calc) (ref <130)
TRIGLYCERIDES: 40 mg/dL (ref <150)
Total CHOL/HDL Ratio: 2.1 (calc) (ref <5.0)

## 2017-03-14 LAB — RPR: RPR: NONREACTIVE

## 2017-03-14 LAB — LIPASE: LIPASE: 42 U/L (ref 7–60)

## 2017-03-14 LAB — CK: CK TOTAL: 388 U/L — AB (ref 44–196)

## 2017-03-14 LAB — AMYLASE: Amylase: 47 U/L (ref 21–101)

## 2017-03-14 LAB — C. TRACHOMATIS/N. GONORRHOEAE RNA
C. TRACHOMATIS RNA, TMA: NOT DETECTED
N. gonorrhoeae RNA, TMA: NOT DETECTED

## 2017-03-14 LAB — PHOSPHORUS: PHOSPHORUS: 3.6 mg/dL (ref 2.5–4.5)

## 2017-03-14 LAB — HIV ANTIBODY (ROUTINE TESTING W REFLEX): HIV: NONREACTIVE

## 2017-03-15 LAB — CT/NG RNA, TMA RECTAL
Chlamydia Trachomatis RNA: NOT DETECTED
Neisseria Gonorrhoeae RNA: NOT DETECTED

## 2017-04-02 ENCOUNTER — Encounter: Payer: BLUE CROSS/BLUE SHIELD | Admitting: *Deleted

## 2017-04-02 VITALS — BP 125/79 | HR 79 | Temp 98.1°F | Wt 202.5 lb

## 2017-04-02 DIAGNOSIS — Z006 Encounter for examination for normal comparison and control in clinical research program: Secondary | ICD-10-CM

## 2017-04-02 LAB — AMYLASE: Amylase: 51 U/L (ref 21–101)

## 2017-04-02 LAB — CK: Total CK: 200 U/L — ABNORMAL HIGH (ref 44–196)

## 2017-04-02 LAB — COMPREHENSIVE METABOLIC PANEL
AG Ratio: 1.5 (calc) (ref 1.0–2.5)
ALBUMIN MSPROF: 4 g/dL (ref 3.6–5.1)
ALT: 41 U/L (ref 9–46)
AST: 21 U/L (ref 10–40)
Alkaline phosphatase (APISO): 60 U/L (ref 40–115)
BILIRUBIN TOTAL: 0.5 mg/dL (ref 0.2–1.2)
BUN: 15 mg/dL (ref 7–25)
CALCIUM: 9.4 mg/dL (ref 8.6–10.3)
CHLORIDE: 103 mmol/L (ref 98–110)
CO2: 30 mmol/L (ref 20–32)
CREATININE: 1.24 mg/dL (ref 0.60–1.35)
Globulin: 2.7 g/dL (calc) (ref 1.9–3.7)
Glucose, Bld: 77 mg/dL (ref 65–99)
POTASSIUM: 4.3 mmol/L (ref 3.5–5.3)
Sodium: 139 mmol/L (ref 135–146)
Total Protein: 6.7 g/dL (ref 6.1–8.1)

## 2017-04-02 LAB — CBC WITH DIFFERENTIAL/PLATELET
Basophils Absolute: 38 cells/uL (ref 0–200)
Basophils Relative: 0.7 %
Eosinophils Absolute: 32 cells/uL (ref 15–500)
Eosinophils Relative: 0.6 %
HEMATOCRIT: 46.9 % (ref 38.5–50.0)
HEMOGLOBIN: 15.9 g/dL (ref 13.2–17.1)
LYMPHS ABS: 1598 {cells}/uL (ref 850–3900)
MCH: 30.4 pg (ref 27.0–33.0)
MCHC: 33.9 g/dL (ref 32.0–36.0)
MCV: 89.7 fL (ref 80.0–100.0)
MONOS PCT: 7.7 %
MPV: 11.5 fL (ref 7.5–12.5)
Neutro Abs: 3316 cells/uL (ref 1500–7800)
Neutrophils Relative %: 61.4 %
Platelets: 253 10*3/uL (ref 140–400)
RBC: 5.23 10*6/uL (ref 4.20–5.80)
RDW: 13.2 % (ref 11.0–15.0)
Total Lymphocyte: 29.6 %
WBC mixed population: 416 cells/uL (ref 200–950)
WBC: 5.4 10*3/uL (ref 3.8–10.8)

## 2017-04-02 LAB — LIPASE: Lipase: 40 U/L (ref 7–60)

## 2017-04-02 LAB — PHOSPHORUS: Phosphorus: 2.9 mg/dL (ref 2.5–4.5)

## 2017-04-02 LAB — HIV ANTIBODY (ROUTINE TESTING W REFLEX): HIV: NONREACTIVE

## 2017-04-02 NOTE — Progress Notes (Signed)
Study: A Phase 2b/3 Double Blind Safety and Efficacy Study of Injectable Cabotegravir compared to Daily Oral Tenofovir Disoproxil Fumarate/Emtricitabine (TDF/FTC), For Pre-Exposure Prophylaxis in HIV-Uninfected Cisgender Men and Transgender Women who have sex with Men.  Medication: Investigational Injectable Cabotegravir/placebo compared to Truvada/placebo. Duration: Around 4 years.  Glenn Rivera is here for week 59. Denies any problems after his last injection. No new complaints or concerns. States excellent adherence with his oral study medication. Rapid HIV non-reactive. Next visit scheduled for 05/14/17 at 8:30am.

## 2017-05-14 ENCOUNTER — Encounter (INDEPENDENT_AMBULATORY_CARE_PROVIDER_SITE_OTHER): Payer: Self-pay | Admitting: *Deleted

## 2017-05-14 VITALS — BP 117/80 | HR 99 | Temp 97.9°F | Wt 202.2 lb

## 2017-05-14 DIAGNOSIS — Z006 Encounter for examination for normal comparison and control in clinical research program: Secondary | ICD-10-CM

## 2017-05-14 NOTE — Progress Notes (Signed)
Study: A Phase 2b/3 Double Blind Safety and Efficacy Study of Injectable Cabotegravir compared to Daily Oral Tenofovir Disoproxil Fumarate/Emtricitabine (TDF/FTC), For Pre-Exposure Prophylaxis in HIV-Uninfected Cisgender Men and Transgender Women who have sex with Men.  Medication: Investigational Injectable Cabotegravir/placebo compared to Truvada/placebo. Duration: Around 4 years.  Glenn Rivera is here for week 65 visit. Returned 34 pills, adherence 90% with oral study medication. No new concerns or medications. Rapid HIV non-reactive. Injection given (L) buttock. Site unremarkable. He will return in 2 weeks for safety visit.

## 2017-05-15 LAB — CBC WITH DIFFERENTIAL/PLATELET
BASOS PCT: 1 %
Basophils Absolute: 39 cells/uL (ref 0–200)
Eosinophils Absolute: 109 cells/uL (ref 15–500)
Eosinophils Relative: 2.8 %
HEMATOCRIT: 50.7 % — AB (ref 38.5–50.0)
Hemoglobin: 17 g/dL (ref 13.2–17.1)
Lymphs Abs: 1170 cells/uL (ref 850–3900)
MCH: 29.8 pg (ref 27.0–33.0)
MCHC: 33.5 g/dL (ref 32.0–36.0)
MCV: 88.9 fL (ref 80.0–100.0)
MPV: 11.6 fL (ref 7.5–12.5)
Monocytes Relative: 7.6 %
NEUTROS PCT: 58.6 %
Neutro Abs: 2285 cells/uL (ref 1500–7800)
Platelets: 215 10*3/uL (ref 140–400)
RBC: 5.7 10*6/uL (ref 4.20–5.80)
RDW: 13 % (ref 11.0–15.0)
Total Lymphocyte: 30 %
WBC: 3.9 10*3/uL (ref 3.8–10.8)
WBCMIX: 296 {cells}/uL (ref 200–950)

## 2017-05-15 LAB — COMPREHENSIVE METABOLIC PANEL
AG RATIO: 1.6 (calc) (ref 1.0–2.5)
ALKALINE PHOSPHATASE (APISO): 63 U/L (ref 40–115)
ALT: 38 U/L (ref 9–46)
AST: 25 U/L (ref 10–40)
Albumin: 4.4 g/dL (ref 3.6–5.1)
BUN: 17 mg/dL (ref 7–25)
CHLORIDE: 103 mmol/L (ref 98–110)
CO2: 28 mmol/L (ref 20–32)
Calcium: 9.7 mg/dL (ref 8.6–10.3)
Creat: 1.16 mg/dL (ref 0.60–1.35)
GLOBULIN: 2.7 g/dL (ref 1.9–3.7)
Glucose, Bld: 72 mg/dL (ref 65–99)
POTASSIUM: 4 mmol/L (ref 3.5–5.3)
Sodium: 139 mmol/L (ref 135–146)
Total Bilirubin: 0.5 mg/dL (ref 0.2–1.2)
Total Protein: 7.1 g/dL (ref 6.1–8.1)

## 2017-05-15 LAB — PHOSPHORUS: Phosphorus: 2.9 mg/dL (ref 2.5–4.5)

## 2017-05-15 LAB — CK: Total CK: 169 U/L (ref 44–196)

## 2017-05-15 LAB — HIV ANTIBODY (ROUTINE TESTING W REFLEX): HIV 1&2 Ab, 4th Generation: NONREACTIVE

## 2017-05-15 LAB — LIPASE: LIPASE: 44 U/L (ref 7–60)

## 2017-05-15 LAB — AMYLASE: AMYLASE: 57 U/L (ref 21–101)

## 2017-05-26 ENCOUNTER — Encounter: Payer: BLUE CROSS/BLUE SHIELD | Admitting: *Deleted

## 2017-05-27 ENCOUNTER — Encounter (INDEPENDENT_AMBULATORY_CARE_PROVIDER_SITE_OTHER): Payer: Self-pay | Admitting: *Deleted

## 2017-05-27 VITALS — BP 118/80 | HR 82 | Temp 98.0°F | Wt 204.5 lb

## 2017-05-27 DIAGNOSIS — Z006 Encounter for examination for normal comparison and control in clinical research program: Secondary | ICD-10-CM

## 2017-05-27 NOTE — Progress Notes (Signed)
Study: A Phase 2b/3 Double Blind Safety and Efficacy Study of Injectable Cabotegravir compared to Daily Oral Tenofovir Disoproxil Fumarate/Emtricitabine (TDF/FTC), For Pre-Exposure Prophylaxis in HIV-Uninfected Cisgender Men and Transgender Women who have sex with Men.  Medication: Investigational Injectable Cabotegravir/placebo compared to Truvada/placebo. Duration: Around 4 years.  Glenn Rivera is here for week 67 visit. Denied any injection site reaction. States excellent adherence with his oral study medication. Rapid HIV non-reactive. He will return in April for his next study visit.

## 2017-05-28 LAB — CBC WITH DIFFERENTIAL/PLATELET
BASOS ABS: 20 {cells}/uL (ref 0–200)
Basophils Relative: 0.6 %
EOS PCT: 5.5 %
Eosinophils Absolute: 182 cells/uL (ref 15–500)
HCT: 48.2 % (ref 38.5–50.0)
HEMOGLOBIN: 16.1 g/dL (ref 13.2–17.1)
Lymphs Abs: 1277 cells/uL (ref 850–3900)
MCH: 29.9 pg (ref 27.0–33.0)
MCHC: 33.4 g/dL (ref 32.0–36.0)
MCV: 89.4 fL (ref 80.0–100.0)
MONOS PCT: 12.2 %
MPV: 11.8 fL (ref 7.5–12.5)
NEUTROS ABS: 1419 {cells}/uL — AB (ref 1500–7800)
Neutrophils Relative %: 43 %
Platelets: 202 10*3/uL (ref 140–400)
RBC: 5.39 10*6/uL (ref 4.20–5.80)
RDW: 12.6 % (ref 11.0–15.0)
Total Lymphocyte: 38.7 %
WBC mixed population: 403 cells/uL (ref 200–950)
WBC: 3.3 10*3/uL — AB (ref 3.8–10.8)

## 2017-05-28 LAB — COMPREHENSIVE METABOLIC PANEL
AG RATIO: 1.7 (calc) (ref 1.0–2.5)
ALT: 26 U/L (ref 9–46)
AST: 20 U/L (ref 10–40)
Albumin: 4.2 g/dL (ref 3.6–5.1)
Alkaline phosphatase (APISO): 62 U/L (ref 40–115)
BUN: 11 mg/dL (ref 7–25)
CO2: 32 mmol/L (ref 20–32)
Calcium: 9.6 mg/dL (ref 8.6–10.3)
Chloride: 102 mmol/L (ref 98–110)
Creat: 1.28 mg/dL (ref 0.60–1.35)
GLUCOSE: 113 mg/dL — AB (ref 65–99)
Globulin: 2.5 g/dL (calc) (ref 1.9–3.7)
Potassium: 4.5 mmol/L (ref 3.5–5.3)
SODIUM: 139 mmol/L (ref 135–146)
TOTAL PROTEIN: 6.7 g/dL (ref 6.1–8.1)
Total Bilirubin: 0.5 mg/dL (ref 0.2–1.2)

## 2017-05-28 LAB — HIV ANTIBODY (ROUTINE TESTING W REFLEX): HIV 1&2 Ab, 4th Generation: NONREACTIVE

## 2017-05-28 LAB — LIPASE: Lipase: 25 U/L (ref 7–60)

## 2017-05-28 LAB — PHOSPHORUS: Phosphorus: 4.3 mg/dL (ref 2.5–4.5)

## 2017-05-28 LAB — AMYLASE: Amylase: 47 U/L (ref 21–101)

## 2017-05-28 LAB — CK: CK TOTAL: 148 U/L (ref 44–196)

## 2017-07-08 ENCOUNTER — Encounter (INDEPENDENT_AMBULATORY_CARE_PROVIDER_SITE_OTHER): Payer: Self-pay | Admitting: *Deleted

## 2017-07-08 VITALS — BP 129/79 | HR 77 | Temp 97.6°F | Wt 198.0 lb

## 2017-07-08 DIAGNOSIS — Z006 Encounter for examination for normal comparison and control in clinical research program: Secondary | ICD-10-CM

## 2017-07-08 NOTE — Progress Notes (Signed)
Study: A Phase 2b/3 Double Blind Safety and Efficacy Study of Injectable Cabotegravir compared to Daily Oral Tenofovir Disoproxil Fumarate/Emtricitabine (TDF/FTC), For Pre-Exposure Prophylaxis in HIV-Uninfected Cisgender Men and Transgender Women who have sex with Men.  Medication: Investigational Injectable Cabotegravir/placebo compared to Truvada/placebo. Duration: Around 4 years.  Glenn Rivera is here for week 73 visit. Continues to have increase stress related to his family. States that he started seeing a counselor at [facility] about a month ago and feels that it helping. Excellent adherence with his oral study medication. Rapid HIV non-reactive. Cabotegravir/placebo injection given (L) glute. Site unremarkable. He will return in 2 weeks for his next study visit.

## 2017-07-09 LAB — CBC WITH DIFFERENTIAL/PLATELET
BASOS ABS: 31 {cells}/uL (ref 0–200)
Basophils Relative: 0.9 %
Eosinophils Absolute: 88 cells/uL (ref 15–500)
Eosinophils Relative: 2.6 %
HEMATOCRIT: 50.3 % — AB (ref 38.5–50.0)
Hemoglobin: 17.1 g/dL (ref 13.2–17.1)
LYMPHS ABS: 1224 {cells}/uL (ref 850–3900)
MCH: 30.5 pg (ref 27.0–33.0)
MCHC: 34 g/dL (ref 32.0–36.0)
MCV: 89.8 fL (ref 80.0–100.0)
MPV: 11.6 fL (ref 7.5–12.5)
Monocytes Relative: 5.8 %
NEUTROS PCT: 54.7 %
Neutro Abs: 1860 cells/uL (ref 1500–7800)
PLATELETS: 218 10*3/uL (ref 140–400)
RBC: 5.6 10*6/uL (ref 4.20–5.80)
RDW: 12.2 % (ref 11.0–15.0)
TOTAL LYMPHOCYTE: 36 %
WBC: 3.4 10*3/uL — ABNORMAL LOW (ref 3.8–10.8)
WBCMIX: 197 {cells}/uL — AB (ref 200–950)

## 2017-07-09 LAB — COMPREHENSIVE METABOLIC PANEL
AG Ratio: 1.7 (calc) (ref 1.0–2.5)
ALKALINE PHOSPHATASE (APISO): 60 U/L (ref 40–115)
ALT: 32 U/L (ref 9–46)
AST: 22 U/L (ref 10–40)
Albumin: 4.5 g/dL (ref 3.6–5.1)
BILIRUBIN TOTAL: 0.8 mg/dL (ref 0.2–1.2)
BUN: 13 mg/dL (ref 7–25)
CO2: 30 mmol/L (ref 20–32)
Calcium: 10 mg/dL (ref 8.6–10.3)
Chloride: 102 mmol/L (ref 98–110)
Creat: 1.25 mg/dL (ref 0.60–1.35)
Globulin: 2.7 g/dL (calc) (ref 1.9–3.7)
Glucose, Bld: 107 mg/dL — ABNORMAL HIGH (ref 65–99)
POTASSIUM: 4.2 mmol/L (ref 3.5–5.3)
Sodium: 139 mmol/L (ref 135–146)
Total Protein: 7.2 g/dL (ref 6.1–8.1)

## 2017-07-09 LAB — LIPASE: LIPASE: 21 U/L (ref 7–60)

## 2017-07-09 LAB — PHOSPHORUS: Phosphorus: 3.6 mg/dL (ref 2.5–4.5)

## 2017-07-09 LAB — HIV ANTIBODY (ROUTINE TESTING W REFLEX): HIV 1&2 Ab, 4th Generation: NONREACTIVE

## 2017-07-09 LAB — AMYLASE: AMYLASE: 41 U/L (ref 21–101)

## 2017-07-09 LAB — CK: CK TOTAL: 153 U/L (ref 44–196)

## 2017-07-22 ENCOUNTER — Encounter (INDEPENDENT_AMBULATORY_CARE_PROVIDER_SITE_OTHER): Payer: BLUE CROSS/BLUE SHIELD | Admitting: *Deleted

## 2017-07-22 VITALS — BP 117/77 | HR 80 | Temp 98.1°F | Wt 198.2 lb

## 2017-07-22 DIAGNOSIS — Z006 Encounter for examination for normal comparison and control in clinical research program: Secondary | ICD-10-CM

## 2017-07-22 LAB — CBC WITH DIFFERENTIAL/PLATELET
BASOS ABS: 19 {cells}/uL (ref 0–200)
Basophils Relative: 0.5 %
Eosinophils Absolute: 152 cells/uL (ref 15–500)
Eosinophils Relative: 4.1 %
HEMATOCRIT: 47.1 % (ref 38.5–50.0)
Hemoglobin: 16.2 g/dL (ref 13.2–17.1)
LYMPHS ABS: 1277 {cells}/uL (ref 850–3900)
MCH: 30.8 pg (ref 27.0–33.0)
MCHC: 34.4 g/dL (ref 32.0–36.0)
MCV: 89.5 fL (ref 80.0–100.0)
MPV: 11.9 fL (ref 7.5–12.5)
Monocytes Relative: 10.7 %
NEUTROS PCT: 50.2 %
Neutro Abs: 1857 cells/uL (ref 1500–7800)
Platelets: 208 10*3/uL (ref 140–400)
RBC: 5.26 10*6/uL (ref 4.20–5.80)
RDW: 12.5 % (ref 11.0–15.0)
Total Lymphocyte: 34.5 %
WBC: 3.7 10*3/uL — AB (ref 3.8–10.8)
WBCMIX: 396 {cells}/uL (ref 200–950)

## 2017-07-22 LAB — COMPREHENSIVE METABOLIC PANEL
AG Ratio: 1.7 (calc) (ref 1.0–2.5)
ALBUMIN MSPROF: 4.3 g/dL (ref 3.6–5.1)
ALT: 36 U/L (ref 9–46)
AST: 24 U/L (ref 10–40)
Alkaline phosphatase (APISO): 59 U/L (ref 40–115)
BUN: 12 mg/dL (ref 7–25)
CHLORIDE: 102 mmol/L (ref 98–110)
CO2: 31 mmol/L (ref 20–32)
CREATININE: 1.13 mg/dL (ref 0.60–1.35)
Calcium: 9.7 mg/dL (ref 8.6–10.3)
GLOBULIN: 2.6 g/dL (ref 1.9–3.7)
GLUCOSE: 76 mg/dL (ref 65–99)
POTASSIUM: 4.2 mmol/L (ref 3.5–5.3)
Sodium: 139 mmol/L (ref 135–146)
Total Bilirubin: 0.6 mg/dL (ref 0.2–1.2)
Total Protein: 6.9 g/dL (ref 6.1–8.1)

## 2017-07-22 LAB — CK: Total CK: 172 U/L (ref 44–196)

## 2017-07-22 LAB — PHOSPHORUS: Phosphorus: 2.9 mg/dL (ref 2.5–4.5)

## 2017-07-22 LAB — LIPASE: Lipase: 32 U/L (ref 7–60)

## 2017-07-22 LAB — AMYLASE: Amylase: 45 U/L (ref 21–101)

## 2017-07-22 NOTE — Progress Notes (Signed)
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 He denies any problems with the last injection. He says his adherence is good. He has started going to physical therapy to help strengthen his core due to scoliosis. 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 He is due to return on June 6th.

## 2017-07-23 LAB — HIV ANTIBODY (ROUTINE TESTING W REFLEX): HIV: NONREACTIVE

## 2017-08-28 ENCOUNTER — Encounter (INDEPENDENT_AMBULATORY_CARE_PROVIDER_SITE_OTHER): Payer: Self-pay | Admitting: *Deleted

## 2017-08-28 VITALS — BP 123/79 | HR 90 | Temp 98.0°F | Wt 196.0 lb

## 2017-08-28 DIAGNOSIS — Z006 Encounter for examination for normal comparison and control in clinical research program: Secondary | ICD-10-CM

## 2017-08-28 NOTE — Progress Notes (Signed)
Glenn Rivera is here for his week 2481 HPTN 083 visit. No new complaints or concerns. Adherence at 98% with his oral study medication. Rapid HIV non-reactive. Cabotegravir/placebo injection given (L) glute. Site unremarkable. Next visit scheduled for 09/17/17.

## 2017-08-29 LAB — CBC WITH DIFFERENTIAL/PLATELET
BASOS ABS: 29 {cells}/uL (ref 0–200)
BASOS PCT: 0.9 %
EOS ABS: 90 {cells}/uL (ref 15–500)
Eosinophils Relative: 2.8 %
HCT: 46.5 % (ref 38.5–50.0)
Hemoglobin: 15.8 g/dL (ref 13.2–17.1)
Lymphs Abs: 1325 cells/uL (ref 850–3900)
MCH: 30.4 pg (ref 27.0–33.0)
MCHC: 34 g/dL (ref 32.0–36.0)
MCV: 89.4 fL (ref 80.0–100.0)
MPV: 12 fL (ref 7.5–12.5)
Monocytes Relative: 7.2 %
NEUTROS PCT: 47.7 %
Neutro Abs: 1526 cells/uL (ref 1500–7800)
PLATELETS: 205 10*3/uL (ref 140–400)
RBC: 5.2 10*6/uL (ref 4.20–5.80)
RDW: 12.9 % (ref 11.0–15.0)
Total Lymphocyte: 41.4 %
WBC: 3.2 10*3/uL — ABNORMAL LOW (ref 3.8–10.8)
WBCMIX: 230 {cells}/uL (ref 200–950)

## 2017-08-29 LAB — PHOSPHORUS: PHOSPHORUS: 3.6 mg/dL (ref 2.5–4.5)

## 2017-08-29 LAB — COMPREHENSIVE METABOLIC PANEL
AG RATIO: 1.7 (calc) (ref 1.0–2.5)
ALKALINE PHOSPHATASE (APISO): 53 U/L (ref 40–115)
ALT: 29 U/L (ref 9–46)
AST: 20 U/L (ref 10–40)
Albumin: 4.5 g/dL (ref 3.6–5.1)
BUN: 11 mg/dL (ref 7–25)
CHLORIDE: 104 mmol/L (ref 98–110)
CO2: 29 mmol/L (ref 20–32)
Calcium: 9.8 mg/dL (ref 8.6–10.3)
Creat: 1.1 mg/dL (ref 0.60–1.35)
GLOBULIN: 2.6 g/dL (ref 1.9–3.7)
Glucose, Bld: 91 mg/dL (ref 65–99)
Potassium: 4.7 mmol/L (ref 3.5–5.3)
Sodium: 138 mmol/L (ref 135–146)
Total Bilirubin: 0.6 mg/dL (ref 0.2–1.2)
Total Protein: 7.1 g/dL (ref 6.1–8.1)

## 2017-08-29 LAB — RPR: RPR Ser Ql: NONREACTIVE

## 2017-08-29 LAB — C. TRACHOMATIS/N. GONORRHOEAE RNA
C. trachomatis RNA, TMA: NOT DETECTED
N. GONORRHOEAE RNA, TMA: NOT DETECTED

## 2017-08-29 LAB — LIPASE: LIPASE: 22 U/L (ref 7–60)

## 2017-08-29 LAB — CK: Total CK: 185 U/L (ref 44–196)

## 2017-08-29 LAB — AMYLASE: AMYLASE: 47 U/L (ref 21–101)

## 2017-08-29 LAB — HIV ANTIBODY (ROUTINE TESTING W REFLEX): HIV 1&2 Ab, 4th Generation: NONREACTIVE

## 2017-08-30 LAB — CT/NG RNA, TMA RECTAL
Chlamydia Trachomatis RNA: NOT DETECTED
NEISSERIA GONORRHOEAE RNA: NOT DETECTED

## 2017-09-17 ENCOUNTER — Encounter (INDEPENDENT_AMBULATORY_CARE_PROVIDER_SITE_OTHER): Payer: Self-pay

## 2017-09-17 VITALS — BP 111/71 | HR 85 | Temp 98.3°F | Wt 193.5 lb

## 2017-09-17 DIAGNOSIS — Z006 Encounter for examination for normal comparison and control in clinical research program: Secondary | ICD-10-CM

## 2017-09-17 NOTE — Progress Notes (Signed)
Participant here for week 83 safety visit for [id number] study. Voiced no complaints of injection site symptoms from last visit. No new health concerns. Vital signs normal. Patients rapid HIV negative. Scheduled next study visit for 7/30.

## 2017-09-18 LAB — COMPREHENSIVE METABOLIC PANEL
AG Ratio: 1.6 (calc) (ref 1.0–2.5)
ALBUMIN MSPROF: 4.3 g/dL (ref 3.6–5.1)
ALT: 26 U/L (ref 9–46)
AST: 22 U/L (ref 10–40)
Alkaline phosphatase (APISO): 61 U/L (ref 40–115)
BILIRUBIN TOTAL: 0.5 mg/dL (ref 0.2–1.2)
BUN: 11 mg/dL (ref 7–25)
CO2: 30 mmol/L (ref 20–32)
CREATININE: 1.2 mg/dL (ref 0.60–1.35)
Calcium: 9.7 mg/dL (ref 8.6–10.3)
Chloride: 101 mmol/L (ref 98–110)
GLOBULIN: 2.7 g/dL (ref 1.9–3.7)
Glucose, Bld: 88 mg/dL (ref 65–99)
POTASSIUM: 4.4 mmol/L (ref 3.5–5.3)
SODIUM: 137 mmol/L (ref 135–146)
TOTAL PROTEIN: 7 g/dL (ref 6.1–8.1)

## 2017-09-18 LAB — CBC WITH DIFFERENTIAL/PLATELET
BASOS ABS: 32 {cells}/uL (ref 0–200)
Basophils Relative: 0.8 %
EOS ABS: 80 {cells}/uL (ref 15–500)
EOS PCT: 2 %
HCT: 47.7 % (ref 38.5–50.0)
Hemoglobin: 15.8 g/dL (ref 13.2–17.1)
Lymphs Abs: 1244 cells/uL (ref 850–3900)
MCH: 30 pg (ref 27.0–33.0)
MCHC: 33.1 g/dL (ref 32.0–36.0)
MCV: 90.7 fL (ref 80.0–100.0)
MONOS PCT: 8.3 %
MPV: 11.7 fL (ref 7.5–12.5)
NEUTROS PCT: 57.8 %
Neutro Abs: 2312 cells/uL (ref 1500–7800)
PLATELETS: 214 10*3/uL (ref 140–400)
RBC: 5.26 10*6/uL (ref 4.20–5.80)
RDW: 13 % (ref 11.0–15.0)
TOTAL LYMPHOCYTE: 31.1 %
WBC mixed population: 332 cells/uL (ref 200–950)
WBC: 4 10*3/uL (ref 3.8–10.8)

## 2017-09-18 LAB — AMYLASE: Amylase: 43 U/L (ref 21–101)

## 2017-09-18 LAB — HIV ANTIBODY (ROUTINE TESTING W REFLEX): HIV 1&2 Ab, 4th Generation: NONREACTIVE

## 2017-09-18 LAB — LIPASE: Lipase: 25 U/L (ref 7–60)

## 2017-09-18 LAB — PHOSPHORUS: Phosphorus: 3.8 mg/dL (ref 2.5–4.5)

## 2017-09-18 LAB — CK: Total CK: 173 U/L (ref 44–196)

## 2017-10-21 ENCOUNTER — Encounter (INDEPENDENT_AMBULATORY_CARE_PROVIDER_SITE_OTHER): Payer: Self-pay | Admitting: *Deleted

## 2017-10-21 VITALS — BP 116/77 | HR 88 | Temp 98.2°F | Wt 196.8 lb

## 2017-10-21 DIAGNOSIS — Z006 Encounter for examination for normal comparison and control in clinical research program: Secondary | ICD-10-CM

## 2017-10-21 NOTE — Progress Notes (Signed)
Glenn Rivera is here for his week 689 HPTN 083 visit. Had an injury to his (L) index finger last week from 3-D printer which required sutures. He was given a tetanus vaccine. Has an appointment later today to have the sutures removed. Did not bring oral study medication with him today but verbalized excellent adherence. Rapid HIV non-reactive. Cabotegravir/placebo injection given (L) glute. Site unremarkable. He will return on 11/10/17 for his next study visit.

## 2017-10-22 LAB — CBC WITH DIFFERENTIAL/PLATELET
BASOS PCT: 0.9 %
Basophils Absolute: 42 cells/uL (ref 0–200)
Eosinophils Absolute: 212 cells/uL (ref 15–500)
Eosinophils Relative: 4.5 %
HEMATOCRIT: 47.4 % (ref 38.5–50.0)
HEMOGLOBIN: 16.4 g/dL (ref 13.2–17.1)
LYMPHS ABS: 1405 {cells}/uL (ref 850–3900)
MCH: 30.6 pg (ref 27.0–33.0)
MCHC: 34.6 g/dL (ref 32.0–36.0)
MCV: 88.4 fL (ref 80.0–100.0)
MONOS PCT: 6.6 %
MPV: 11.9 fL (ref 7.5–12.5)
NEUTROS ABS: 2731 {cells}/uL (ref 1500–7800)
Neutrophils Relative %: 58.1 %
Platelets: 235 10*3/uL (ref 140–400)
RBC: 5.36 10*6/uL (ref 4.20–5.80)
RDW: 12.7 % (ref 11.0–15.0)
Total Lymphocyte: 29.9 %
WBC: 4.7 10*3/uL (ref 3.8–10.8)
WBCMIX: 310 {cells}/uL (ref 200–950)

## 2017-10-22 LAB — COMPREHENSIVE METABOLIC PANEL
AG RATIO: 1.7 (calc) (ref 1.0–2.5)
ALT: 36 U/L (ref 9–46)
AST: 22 U/L (ref 10–40)
Albumin: 4.3 g/dL (ref 3.6–5.1)
Alkaline phosphatase (APISO): 63 U/L (ref 40–115)
BUN: 14 mg/dL (ref 7–25)
CHLORIDE: 103 mmol/L (ref 98–110)
CO2: 28 mmol/L (ref 20–32)
Calcium: 9.8 mg/dL (ref 8.6–10.3)
Creat: 1.15 mg/dL (ref 0.60–1.35)
GLOBULIN: 2.6 g/dL (ref 1.9–3.7)
GLUCOSE: 88 mg/dL (ref 65–99)
Potassium: 4.1 mmol/L (ref 3.5–5.3)
SODIUM: 139 mmol/L (ref 135–146)
Total Bilirubin: 0.6 mg/dL (ref 0.2–1.2)
Total Protein: 6.9 g/dL (ref 6.1–8.1)

## 2017-10-22 LAB — AMYLASE: Amylase: 46 U/L (ref 21–101)

## 2017-10-22 LAB — HIV ANTIBODY (ROUTINE TESTING W REFLEX): HIV: NONREACTIVE

## 2017-10-22 LAB — CK: Total CK: 143 U/L (ref 44–196)

## 2017-10-22 LAB — LIPASE: LIPASE: 29 U/L (ref 7–60)

## 2017-10-22 LAB — PHOSPHORUS: Phosphorus: 3.1 mg/dL (ref 2.5–4.5)

## 2017-11-10 ENCOUNTER — Encounter (INDEPENDENT_AMBULATORY_CARE_PROVIDER_SITE_OTHER): Payer: Self-pay

## 2017-11-10 VITALS — BP 127/70 | HR 90 | Temp 98.5°F | Wt 201.2 lb

## 2017-11-10 DIAGNOSIS — Z006 Encounter for examination for normal comparison and control in clinical research program: Secondary | ICD-10-CM

## 2017-11-10 NOTE — Progress Notes (Signed)
Participant here for [id number] study visit. Denies complications at the injection site. Rapid HIV non-reactive. Participant is scheduled for next study visit on 12/15/17.

## 2017-11-11 LAB — COMPREHENSIVE METABOLIC PANEL
AG Ratio: 1.7 (calc) (ref 1.0–2.5)
ALBUMIN MSPROF: 4.4 g/dL (ref 3.6–5.1)
ALKALINE PHOSPHATASE (APISO): 74 U/L (ref 40–115)
ALT: 55 U/L — ABNORMAL HIGH (ref 9–46)
AST: 38 U/L (ref 10–40)
BILIRUBIN TOTAL: 0.4 mg/dL (ref 0.2–1.2)
BUN: 13 mg/dL (ref 7–25)
CHLORIDE: 105 mmol/L (ref 98–110)
CO2: 28 mmol/L (ref 20–32)
CREATININE: 1.18 mg/dL (ref 0.60–1.35)
Calcium: 9.6 mg/dL (ref 8.6–10.3)
GLOBULIN: 2.6 g/dL (ref 1.9–3.7)
Glucose, Bld: 83 mg/dL (ref 65–99)
POTASSIUM: 4.1 mmol/L (ref 3.5–5.3)
Sodium: 140 mmol/L (ref 135–146)
Total Protein: 7 g/dL (ref 6.1–8.1)

## 2017-11-11 LAB — CBC WITH DIFFERENTIAL/PLATELET
Basophils Absolute: 50 cells/uL (ref 0–200)
Basophils Relative: 0.9 %
EOS ABS: 470 {cells}/uL (ref 15–500)
EOS PCT: 8.4 %
HEMATOCRIT: 45.8 % (ref 38.5–50.0)
HEMOGLOBIN: 15.2 g/dL (ref 13.2–17.1)
LYMPHS ABS: 1646 {cells}/uL (ref 850–3900)
MCH: 30.3 pg (ref 27.0–33.0)
MCHC: 33.2 g/dL (ref 32.0–36.0)
MCV: 91.4 fL (ref 80.0–100.0)
MONOS PCT: 7.7 %
MPV: 11.3 fL (ref 7.5–12.5)
NEUTROS PCT: 53.6 %
Neutro Abs: 3002 cells/uL (ref 1500–7800)
Platelets: 279 10*3/uL (ref 140–400)
RBC: 5.01 10*6/uL (ref 4.20–5.80)
RDW: 12.9 % (ref 11.0–15.0)
Total Lymphocyte: 29.4 %
WBC mixed population: 431 cells/uL (ref 200–950)
WBC: 5.6 10*3/uL (ref 3.8–10.8)

## 2017-11-11 LAB — PHOSPHORUS: PHOSPHORUS: 2.6 mg/dL (ref 2.5–4.5)

## 2017-11-11 LAB — HIV ANTIBODY (ROUTINE TESTING W REFLEX): HIV: NONREACTIVE

## 2017-11-11 LAB — LIPASE: LIPASE: 56 U/L (ref 7–60)

## 2017-11-11 LAB — CK: Total CK: 427 U/L — ABNORMAL HIGH (ref 44–196)

## 2017-11-11 LAB — AMYLASE: AMYLASE: 52 U/L (ref 21–101)

## 2017-12-15 ENCOUNTER — Encounter (INDEPENDENT_AMBULATORY_CARE_PROVIDER_SITE_OTHER): Payer: Self-pay

## 2017-12-15 VITALS — BP 108/74 | HR 75 | Temp 97.4°F | Wt 187.1 lb

## 2017-12-15 DIAGNOSIS — Z006 Encounter for examination for normal comparison and control in clinical research program: Secondary | ICD-10-CM

## 2017-12-15 NOTE — Research (Signed)
Participant here for [id number] study visit. States he believes he is doing well with adherence to the study medication. Thinks he may forget when he travels on occasion but overall is doing well. Rapid HIV non-reactive. Today's CES-D 10 score was elevated. Participant states that overall he is well but is having an issue dealing with a relationship that was not compatible and lead to the loss of a friend as well as dealing with issues brought up in therapy sessions recently. He is continuing with talk therapy despite the difficulty and is aware that he can reach out for other resources if needed. Received IM injection of cabotegravir/placebo and new dispense of oral IP. Next study visit is scheduled for 10/9.

## 2017-12-16 LAB — COMPREHENSIVE METABOLIC PANEL
AG Ratio: 1.7 (calc) (ref 1.0–2.5)
ALT: 20 U/L (ref 9–46)
AST: 20 U/L (ref 10–40)
Albumin: 4.3 g/dL (ref 3.6–5.1)
Alkaline phosphatase (APISO): 55 U/L (ref 40–115)
BUN: 15 mg/dL (ref 7–25)
CO2: 28 mmol/L (ref 20–32)
Calcium: 10 mg/dL (ref 8.6–10.3)
Chloride: 104 mmol/L (ref 98–110)
Creat: 1.24 mg/dL (ref 0.60–1.35)
Globulin: 2.6 g/dL (calc) (ref 1.9–3.7)
Glucose, Bld: 84 mg/dL (ref 65–99)
Potassium: 4.3 mmol/L (ref 3.5–5.3)
Sodium: 138 mmol/L (ref 135–146)
Total Bilirubin: 0.4 mg/dL (ref 0.2–1.2)
Total Protein: 6.9 g/dL (ref 6.1–8.1)

## 2017-12-16 LAB — CBC WITH DIFFERENTIAL/PLATELET
BASOS ABS: 39 {cells}/uL (ref 0–200)
BASOS PCT: 0.9 %
EOS ABS: 112 {cells}/uL (ref 15–500)
Eosinophils Relative: 2.6 %
HCT: 45.7 % (ref 38.5–50.0)
HEMOGLOBIN: 15.4 g/dL (ref 13.2–17.1)
Lymphs Abs: 980 cells/uL (ref 850–3900)
MCH: 30.4 pg (ref 27.0–33.0)
MCHC: 33.7 g/dL (ref 32.0–36.0)
MCV: 90.3 fL (ref 80.0–100.0)
MPV: 12.3 fL (ref 7.5–12.5)
Monocytes Relative: 8.9 %
Neutro Abs: 2786 cells/uL (ref 1500–7800)
Neutrophils Relative %: 64.8 %
PLATELETS: 233 10*3/uL (ref 140–400)
RBC: 5.06 10*6/uL (ref 4.20–5.80)
RDW: 12.9 % (ref 11.0–15.0)
TOTAL LYMPHOCYTE: 22.8 %
WBC: 4.3 10*3/uL (ref 3.8–10.8)
WBCMIX: 383 {cells}/uL (ref 200–950)

## 2017-12-16 LAB — CK: Total CK: 226 U/L — ABNORMAL HIGH (ref 44–196)

## 2017-12-16 LAB — PHOSPHORUS: Phosphorus: 3.4 mg/dL (ref 2.5–4.5)

## 2017-12-16 LAB — AMYLASE: Amylase: 52 U/L (ref 21–101)

## 2017-12-16 LAB — LIPASE: Lipase: 31 U/L (ref 7–60)

## 2017-12-16 LAB — HIV ANTIBODY (ROUTINE TESTING W REFLEX): HIV 1&2 Ab, 4th Generation: NONREACTIVE

## 2017-12-31 VITALS — BP 120/83 | HR 84 | Temp 98.2°F | Wt 187.5 lb

## 2017-12-31 DIAGNOSIS — Z006 Encounter for examination for normal comparison and control in clinical research program: Secondary | ICD-10-CM

## 2017-12-31 NOTE — Research (Signed)
Participant here for [id number] follow up visit. No new health issues or changes in mediation. Rapid HIV non-reactive. He voiced no complaints with the study IP injection. Next visit is scheduled for 11/18.

## 2018-01-01 LAB — CBC WITH DIFFERENTIAL/PLATELET
Basophils Absolute: 20 cells/uL (ref 0–200)
Basophils Relative: 0.6 %
EOS PCT: 2.7 %
Eosinophils Absolute: 92 cells/uL (ref 15–500)
HEMATOCRIT: 47.1 % (ref 38.5–50.0)
HEMOGLOBIN: 15.9 g/dL (ref 13.2–17.1)
LYMPHS ABS: 989 {cells}/uL (ref 850–3900)
MCH: 30.3 pg (ref 27.0–33.0)
MCHC: 33.8 g/dL (ref 32.0–36.0)
MCV: 89.7 fL (ref 80.0–100.0)
MPV: 12.3 fL (ref 7.5–12.5)
Monocytes Relative: 11.6 %
NEUTROS ABS: 1904 {cells}/uL (ref 1500–7800)
Neutrophils Relative %: 56 %
Platelets: 215 10*3/uL (ref 140–400)
RBC: 5.25 10*6/uL (ref 4.20–5.80)
RDW: 12.4 % (ref 11.0–15.0)
Total Lymphocyte: 29.1 %
WBC mixed population: 394 cells/uL (ref 200–950)
WBC: 3.4 10*3/uL — ABNORMAL LOW (ref 3.8–10.8)

## 2018-01-01 LAB — PHOSPHORUS: Phosphorus: 2.8 mg/dL (ref 2.5–4.5)

## 2018-01-01 LAB — HIV ANTIBODY (ROUTINE TESTING W REFLEX): HIV 1&2 Ab, 4th Generation: NONREACTIVE

## 2018-01-01 LAB — COMPREHENSIVE METABOLIC PANEL
AG Ratio: 1.7 (calc) (ref 1.0–2.5)
ALT: 27 U/L (ref 9–46)
AST: 21 U/L (ref 10–40)
Albumin: 4.2 g/dL (ref 3.6–5.1)
Alkaline phosphatase (APISO): 57 U/L (ref 40–115)
BUN: 9 mg/dL (ref 7–25)
CALCIUM: 9.8 mg/dL (ref 8.6–10.3)
CO2: 30 mmol/L (ref 20–32)
CREATININE: 1.1 mg/dL (ref 0.60–1.35)
Chloride: 104 mmol/L (ref 98–110)
GLUCOSE: 90 mg/dL (ref 65–99)
Globulin: 2.5 g/dL (calc) (ref 1.9–3.7)
Potassium: 4.1 mmol/L (ref 3.5–5.3)
SODIUM: 139 mmol/L (ref 135–146)
TOTAL PROTEIN: 6.7 g/dL (ref 6.1–8.1)
Total Bilirubin: 0.6 mg/dL (ref 0.2–1.2)

## 2018-01-01 LAB — LIPASE: Lipase: 27 U/L (ref 7–60)

## 2018-01-01 LAB — AMYLASE: AMYLASE: 42 U/L (ref 21–101)

## 2018-01-01 LAB — CK: Total CK: 203 U/L — ABNORMAL HIGH (ref 44–196)

## 2018-02-09 ENCOUNTER — Encounter (INDEPENDENT_AMBULATORY_CARE_PROVIDER_SITE_OTHER): Payer: Self-pay | Admitting: *Deleted

## 2018-02-09 VITALS — BP 127/77 | HR 76 | Temp 98.2°F | Wt 190.2 lb

## 2018-02-09 DIAGNOSIS — Z006 Encounter for examination for normal comparison and control in clinical research program: Secondary | ICD-10-CM

## 2018-02-09 MED ORDER — STUDY - HPTN 083 - EMTRICITABINE/TENOFOVIR DISOPROXIL FUMARATE 200-300MG (TRUVADA) OR PLACEBO TABLET (PI-VAN DAM): 1.0000 | ORAL_TABLET | Freq: Every day | ORAL | Status: DC

## 2018-02-09 MED ORDER — STUDY - HPTN 083 - CABOTEGRAVIR 600MG/3ML INJECTION OR PLACEBO (PI-VAN DAM): 600.0000 mg | INJECTION | INTRAMUSCULAR | 0 refills | Status: DC

## 2018-02-09 NOTE — Research (Signed)
Glenn Rivera is here for his week 105 HPTN 083 visit. No new complaints or medications. 100% adherence with his oral study medication. Rapid HIV non-reactive. Cabotegravir/placebo injection given (L) glute without problem. He will return in 2 weeks for his next study visit.

## 2018-02-10 LAB — URINALYSIS, ROUTINE W REFLEX MICROSCOPIC
Bacteria, UA: NONE SEEN /HPF
Bilirubin Urine: NEGATIVE
GLUCOSE, UA: NEGATIVE
HGB URINE DIPSTICK: NEGATIVE
HYALINE CAST: NONE SEEN /LPF
Ketones, ur: NEGATIVE
Nitrite: NEGATIVE
PH: 7.5 (ref 5.0–8.0)
Protein, ur: NEGATIVE
RBC / HPF: NONE SEEN /HPF (ref 0–2)
SPECIFIC GRAVITY, URINE: 1.012 (ref 1.001–1.03)
Squamous Epithelial / LPF: NONE SEEN /HPF (ref <=5)

## 2018-02-10 LAB — COMPREHENSIVE METABOLIC PANEL
AG Ratio: 1.8 (calc) (ref 1.0–2.5)
ALT: 22 U/L (ref 9–46)
AST: 24 U/L (ref 10–40)
Albumin: 4.1 g/dL (ref 3.6–5.1)
Alkaline phosphatase (APISO): 58 U/L (ref 40–115)
BUN: 14 mg/dL (ref 7–25)
CO2: 26 mmol/L (ref 20–32)
Calcium: 9.5 mg/dL (ref 8.6–10.3)
Chloride: 103 mmol/L (ref 98–110)
Creat: 1.15 mg/dL (ref 0.60–1.35)
GLUCOSE: 77 mg/dL (ref 65–99)
Globulin: 2.3 g/dL (calc) (ref 1.9–3.7)
Potassium: 4.3 mmol/L (ref 3.5–5.3)
Sodium: 138 mmol/L (ref 135–146)
TOTAL PROTEIN: 6.4 g/dL (ref 6.1–8.1)
Total Bilirubin: 0.4 mg/dL (ref 0.2–1.2)

## 2018-02-10 LAB — CBC WITH DIFFERENTIAL/PLATELET
BASOS PCT: 0.9 %
Basophils Absolute: 31 cells/uL (ref 0–200)
EOS PCT: 4.4 %
Eosinophils Absolute: 150 cells/uL (ref 15–500)
HCT: 46.2 % (ref 38.5–50.0)
HEMOGLOBIN: 15.4 g/dL (ref 13.2–17.1)
LYMPHS ABS: 1374 {cells}/uL (ref 850–3900)
MCH: 30 pg (ref 27.0–33.0)
MCHC: 33.3 g/dL (ref 32.0–36.0)
MCV: 90.1 fL (ref 80.0–100.0)
MPV: 11.5 fL (ref 7.5–12.5)
Monocytes Relative: 11.4 %
NEUTROS ABS: 1459 {cells}/uL — AB (ref 1500–7800)
Neutrophils Relative %: 42.9 %
Platelets: 239 10*3/uL (ref 140–400)
RBC: 5.13 10*6/uL (ref 4.20–5.80)
RDW: 12.1 % (ref 11.0–15.0)
Total Lymphocyte: 40.4 %
WBC mixed population: 388 cells/uL (ref 200–950)
WBC: 3.4 10*3/uL — ABNORMAL LOW (ref 3.8–10.8)

## 2018-02-10 LAB — HEPATITIS C ANTIBODY
Hepatitis C Ab: NONREACTIVE
SIGNAL TO CUT-OFF: 0.02 (ref <1.00)

## 2018-02-10 LAB — C. TRACHOMATIS/N. GONORRHOEAE RNA
C. TRACHOMATIS RNA, TMA: NOT DETECTED
N. GONORRHOEAE RNA, TMA: NOT DETECTED

## 2018-02-10 LAB — LIPID PANEL
CHOLESTEROL: 133 mg/dL (ref <200)
HDL: 60 mg/dL (ref >40)
LDL Cholesterol (Calc): 60 mg/dL (calc)
Non-HDL Cholesterol (Calc): 73 mg/dL (calc) (ref <130)
Total CHOL/HDL Ratio: 2.2 (calc) (ref <5.0)
Triglycerides: 46 mg/dL (ref <150)

## 2018-02-10 LAB — LIPASE: Lipase: 59 U/L (ref 7–60)

## 2018-02-10 LAB — AMYLASE: Amylase: 42 U/L (ref 21–101)

## 2018-02-10 LAB — HIV ANTIBODY (ROUTINE TESTING W REFLEX): HIV 1&2 Ab, 4th Generation: NONREACTIVE

## 2018-02-10 LAB — PHOSPHORUS: PHOSPHORUS: 4.1 mg/dL (ref 2.5–4.5)

## 2018-02-10 LAB — RPR: RPR: NONREACTIVE

## 2018-02-10 LAB — CK: CK TOTAL: 282 U/L — AB (ref 44–196)

## 2018-02-11 LAB — CT/NG RNA, TMA RECTAL
Chlamydia Trachomatis RNA: NOT DETECTED
Neisseria Gonorrhoeae RNA: NOT DETECTED

## 2018-02-24 ENCOUNTER — Encounter (INDEPENDENT_AMBULATORY_CARE_PROVIDER_SITE_OTHER): Payer: Self-pay | Admitting: *Deleted

## 2018-02-24 VITALS — BP 131/77 | HR 66 | Temp 97.5°F | Wt 189.2 lb

## 2018-02-24 DIAGNOSIS — Z006 Encounter for examination for normal comparison and control in clinical research program: Secondary | ICD-10-CM

## 2018-02-24 NOTE — Research (Signed)
Glenn Rivera is here for his week 107 HPTN 083 visit. Verbalized feeling upset after having an interaction with his mother this morning. Stated that he was having "a good morning" until she made some comments to him that were upsetting. This has been an ongoing issue. We discussed reaching out to his counselor and he agreed to schedule an appointment. Denied any injection site reaction. Rapid HIV non-reactive. States excellent adherence with his oral study medication. He will return in January for his next study visit.

## 2018-02-25 LAB — CBC WITH DIFFERENTIAL/PLATELET
Basophils Absolute: 30 cells/uL (ref 0–200)
Basophils Relative: 0.8 %
Eosinophils Absolute: 59 cells/uL (ref 15–500)
Eosinophils Relative: 1.6 %
HEMATOCRIT: 48.5 % (ref 38.5–50.0)
Hemoglobin: 16.4 g/dL (ref 13.2–17.1)
Lymphs Abs: 1380 cells/uL (ref 850–3900)
MCH: 30 pg (ref 27.0–33.0)
MCHC: 33.8 g/dL (ref 32.0–36.0)
MCV: 88.7 fL (ref 80.0–100.0)
MPV: 11.7 fL (ref 7.5–12.5)
Monocytes Relative: 7.2 %
Neutro Abs: 1965 cells/uL (ref 1500–7800)
Neutrophils Relative %: 53.1 %
Platelets: 257 10*3/uL (ref 140–400)
RBC: 5.47 10*6/uL (ref 4.20–5.80)
RDW: 12.1 % (ref 11.0–15.0)
Total Lymphocyte: 37.3 %
WBC mixed population: 266 cells/uL (ref 200–950)
WBC: 3.7 10*3/uL — ABNORMAL LOW (ref 3.8–10.8)

## 2018-02-25 LAB — COMPREHENSIVE METABOLIC PANEL
AG Ratio: 1.6 (calc) (ref 1.0–2.5)
ALT: 26 U/L (ref 9–46)
AST: 24 U/L (ref 10–40)
Albumin: 4.4 g/dL (ref 3.6–5.1)
Alkaline phosphatase (APISO): 64 U/L (ref 40–115)
BILIRUBIN TOTAL: 0.3 mg/dL (ref 0.2–1.2)
BUN: 13 mg/dL (ref 7–25)
CO2: 29 mmol/L (ref 20–32)
Calcium: 9.8 mg/dL (ref 8.6–10.3)
Chloride: 102 mmol/L (ref 98–110)
Creat: 0.97 mg/dL (ref 0.60–1.35)
Globulin: 2.7 g/dL (calc) (ref 1.9–3.7)
Glucose, Bld: 70 mg/dL (ref 65–99)
Potassium: 4.6 mmol/L (ref 3.5–5.3)
Sodium: 137 mmol/L (ref 135–146)
TOTAL PROTEIN: 7.1 g/dL (ref 6.1–8.1)

## 2018-02-25 LAB — PHOSPHORUS: Phosphorus: 3.7 mg/dL (ref 2.5–4.5)

## 2018-02-25 LAB — CK: Total CK: 216 U/L — ABNORMAL HIGH (ref 44–196)

## 2018-02-25 LAB — LIPASE: Lipase: 72 U/L — ABNORMAL HIGH (ref 7–60)

## 2018-02-25 LAB — AMYLASE: Amylase: 48 U/L (ref 21–101)

## 2018-02-25 LAB — HIV ANTIBODY (ROUTINE TESTING W REFLEX): HIV 1&2 Ab, 4th Generation: NONREACTIVE

## 2018-03-27 ENCOUNTER — Encounter (HOSPITAL_COMMUNITY): Payer: Self-pay | Admitting: Emergency Medicine

## 2018-03-27 ENCOUNTER — Ambulatory Visit (HOSPITAL_COMMUNITY)
Admission: EM | Admit: 2018-03-27 | Discharge: 2018-03-27 | Disposition: A | Discharge status: Home / Self Care | Payer: BLUE CROSS/BLUE SHIELD | Attending: Family Medicine | Admitting: Family Medicine

## 2018-03-27 ENCOUNTER — Other Ambulatory Visit: Payer: Self-pay

## 2018-03-27 DIAGNOSIS — R369 Urethral discharge, unspecified: Secondary | ICD-10-CM

## 2018-03-27 DIAGNOSIS — Z7252 High risk homosexual behavior: Secondary | ICD-10-CM

## 2018-03-27 DIAGNOSIS — Z113 Encounter for screening for infections with a predominantly sexual mode of transmission: Secondary | ICD-10-CM | POA: Insufficient documentation

## 2018-03-27 DIAGNOSIS — R360 Urethral discharge without blood: Secondary | ICD-10-CM

## 2018-03-27 LAB — POCT URINALYSIS DIP (DEVICE)
Bilirubin Urine: NEGATIVE
Glucose, UA: NEGATIVE mg/dL
Ketones, ur: NEGATIVE mg/dL
Nitrite: NEGATIVE
Protein, ur: NEGATIVE mg/dL
Specific Gravity, Urine: 1.025 (ref 1.005–1.030)
Urobilinogen, UA: 0.2 mg/dL (ref 0.0–1.0)
pH: 6 (ref 5.0–8.0)

## 2018-03-27 MED ORDER — CEFTRIAXONE SODIUM 250 MG IJ SOLR
INTRAMUSCULAR | Status: AC
  Filled 2018-03-27: qty 250

## 2018-03-27 MED ORDER — CEFTRIAXONE SODIUM 250 MG IJ SOLR
250.0000 mg | Freq: Once | INTRAMUSCULAR | Status: AC
  Administered 2018-03-27: 250 mg via INTRAMUSCULAR

## 2018-03-27 MED ORDER — AZITHROMYCIN 250 MG PO TABS
ORAL_TABLET | ORAL | Status: AC
  Filled 2018-03-27: qty 4

## 2018-03-27 MED ORDER — AZITHROMYCIN 250 MG PO TABS
1000.0000 mg | ORAL_TABLET | Freq: Once | ORAL | Status: AC
  Administered 2018-03-27: 1000 mg via ORAL

## 2018-03-27 NOTE — Discharge Instructions (Signed)

## 2018-03-27 NOTE — ED Provider Notes (Signed)
[facility]   [id number] 03/27/18 Arrival Time: [id number]  ASSESSMENT & PLAN:  1. Penile discharge, without blood   2. High risk homosexual behavior    Declines RPR testing. HIV testing 02/24/2018 negative.   Discharge Instructions     You have been given the following medications today for treatment of suspected gonorrhea and/or chlamydia:  cefTRIAXone (ROCEPHIN) injection 250 mg azithromycin (ZITHROMAX) tablet 1,000 mg  Even though we have treated you today, we have sent testing for sexually transmitted infections. We will notify you of any positive results once they are received. If required, we will prescribe any medications you might need.  Please refrain from all sexual activity for at least the next seven days.   Pending: Labs Reviewed  URINE CYTOLOGY ANCILLARY ONLY   Will notify of any positive results. Instructed to refrain from sexual activity for at least seven days.  Reviewed expectations re: course of current medical issues. Questions answered. Outlined signs and symptoms indicating need for more acute intervention. Patient verbalized understanding. After Visit Summary given.   SUBJECTIVE:  Glenn Rivera is a 38 y.o. male who presents with complaint of penile discharge without bleeding. No scrotal/testicular swelling or pain. Onset abrupt, yesterday. Questions mild urinary frequency over the past 2-3 days. No gross hematuria. Describes discharge as thick and white/yellow. No specific dysuria reported. No flank pain or inguinal pain. Afebrile. No abdominal or pelvic pain. No n/v. No rashes or lesions. Sexually active with multiple male partners; two over the past 1-2 weeks; both anal and oral sex without protection. No oral complaints today. OTC treatment: none. History of STI: treated gonorrhea.  ROS: As per HPI. All other systems negative.   OBJECTIVE:  Vitals:   03/27/18 1055  BP: 128/74  Pulse: 76  Temp: 98.2 F (36.8 C)  TempSrc: Oral    SpO2: 100%    General appearance: alert, cooperative, appears stated age and no distress Throat: lips, mucosa, and tongue normal; teeth and gums normal CV: RRR Lungs: CTAB Back: no CVA tenderness; FROM at waist Abdomen: soft, non-tender GU: deferred Skin: warm and dry Psychological: alert and cooperative; normal mood and affect.  Results for orders placed or performed during the hospital encounter of 03/27/18  POCT urinalysis dip (device)  Result Value Ref Range   Glucose, UA NEGATIVE NEGATIVE mg/dL   Bilirubin Urine NEGATIVE NEGATIVE   Ketones, ur NEGATIVE NEGATIVE mg/dL   Specific Gravity, Urine 1.025 1.005 - 1.030   Hgb urine dipstick TRACE (A) NEGATIVE   pH 6.0 5.0 - 8.0   Protein, ur NEGATIVE NEGATIVE mg/dL   Urobilinogen, UA 0.2 0.0 - 1.0 mg/dL   Nitrite NEGATIVE NEGATIVE   Leukocytes, UA SMALL (A) NEGATIVE    Labs Reviewed  URINE CYTOLOGY ANCILLARY ONLY    Allergies  Allergen Reactions  . Lactose Intolerance (Gi) Diarrhea and Other (See Comments)    Diarrhea, upset stomach, and migraine    Past Medical History:  Diagnosis Date  . Depression 03/25/2010   History  . Eczema 2010   intermittent / seasonal on soles of feet bilaterally  . Examination of participant or control in clinical research 02/12/2016   FH: Reported as healthy.  Social History   Socioeconomic History  . Marital status: Single    Spouse name: Not on file  . Number of children: Not on file  . Years of education: Not on file  . Highest education level: Not on file  Occupational History  . Not on file  Social Needs  .  Financial resource strain: Not on file  . Food insecurity:    Worry: Not on file    Inability: Not on file  . Transportation needs:    Medical: Not on file    Non-medical: Not on file  Tobacco Use  . Smoking status: Never Smoker  . Smokeless tobacco: Never Used  Substance and Sexual Activity  . Alcohol use: Yes    Alcohol/week: 2.0 standard drinks    Types: 2  Shots of liquor per week    Comment: 1x/month  . Drug use: No  . Sexual activity: Yes    Comment: Pansexual  Lifestyle  . Physical activity:    Days per week: Not on file    Minutes per session: Not on file  . Stress: Not on file  Relationships  . Social connections:    Talks on phone: Not on file    Gets together: Not on file    Attends religious service: Not on file    Active member of club or organization: Not on file    Attends meetings of clubs or organizations: Not on file    Relationship status: Not on file  . Intimate partner violence:    Fear of current or ex partner: Not on file    Emotionally abused: Not on file    Physically abused: Not on file    Forced sexual activity: Not on file  Other Topics Concern  . Not on file  Social History Narrative  . Not on file          Mardella Layman, MD 03/27/18 1136

## 2018-03-27 NOTE — ED Triage Notes (Signed)
Pt reports penile discharge and stinging with urination along with urinary frequency that started yesterday.

## 2018-03-30 LAB — URINE CYTOLOGY ANCILLARY ONLY
Chlamydia: NEGATIVE
Neisseria Gonorrhea: POSITIVE — AB
Trichomonas: NEGATIVE

## 2018-03-31 ENCOUNTER — Telehealth (HOSPITAL_COMMUNITY): Payer: Self-pay | Admitting: Emergency Medicine

## 2018-03-31 ENCOUNTER — Encounter (HOSPITAL_COMMUNITY): Payer: Self-pay

## 2018-03-31 NOTE — Telephone Encounter (Signed)
Test for gonorrhea was positive. This was treated at the urgent care visit with IM rocephin 250mg and po zithromax 1g. Pt needs education to refrain from sexual intercourse for 7 days after treatment to give the medicine time to work. Sexual partners need to be notified and tested/treated. Condoms may reduce risk of reinfection. Recheck or followup with PCP for further evaluation if symptoms are not improving. [facility] notified.   Attempted to reach patient. No answer at this time. Voicemail left.     

## 2018-04-01 ENCOUNTER — Telehealth (HOSPITAL_COMMUNITY): Payer: Self-pay | Admitting: Emergency Medicine

## 2018-04-01 NOTE — Telephone Encounter (Signed)
Called patient and discussed results with him. All questions answered.

## 2018-04-06 ENCOUNTER — Encounter (INDEPENDENT_AMBULATORY_CARE_PROVIDER_SITE_OTHER): Payer: BLUE CROSS/BLUE SHIELD

## 2018-04-06 VITALS — BP 128/88 | HR 85 | Temp 98.1°F | Wt 193.5 lb

## 2018-04-06 DIAGNOSIS — Z006 Encounter for examination for normal comparison and control in clinical research program: Secondary | ICD-10-CM

## 2018-04-06 NOTE — Research (Signed)
Participant here for [id number] study visit. He has no new concerns. He is doing well with taking his study medication daily. Today's rapid HIV non-reactive. He received new dispense of oral IP and IM injection of cabotegravir/placebo in the left glute. He is scheduled for follow up on 04/21/18.

## 2018-04-07 LAB — COMPREHENSIVE METABOLIC PANEL
AG Ratio: 1.6 (calc) (ref 1.0–2.5)
ALT: 28 U/L (ref 9–46)
AST: 23 U/L (ref 10–40)
Albumin: 4.1 g/dL (ref 3.6–5.1)
Alkaline phosphatase (APISO): 56 U/L (ref 40–115)
BUN: 15 mg/dL (ref 7–25)
CO2: 30 mmol/L (ref 20–32)
CREATININE: 1.05 mg/dL (ref 0.60–1.35)
Calcium: 9.8 mg/dL (ref 8.6–10.3)
Chloride: 104 mmol/L (ref 98–110)
Globulin: 2.6 g/dL (calc) (ref 1.9–3.7)
Glucose, Bld: 78 mg/dL (ref 65–99)
Potassium: 4.5 mmol/L (ref 3.5–5.3)
Sodium: 140 mmol/L (ref 135–146)
Total Bilirubin: 0.5 mg/dL (ref 0.2–1.2)
Total Protein: 6.7 g/dL (ref 6.1–8.1)

## 2018-04-07 LAB — CBC WITH DIFFERENTIAL/PLATELET
Absolute Monocytes: 378 cells/uL (ref 200–950)
Basophils Absolute: 39 cells/uL (ref 0–200)
Basophils Relative: 1 %
Eosinophils Absolute: 90 cells/uL (ref 15–500)
Eosinophils Relative: 2.3 %
HCT: 47.5 % (ref 38.5–50.0)
Hemoglobin: 15.8 g/dL (ref 13.2–17.1)
Lymphs Abs: 1342 cells/uL (ref 850–3900)
MCH: 30 pg (ref 27.0–33.0)
MCHC: 33.3 g/dL (ref 32.0–36.0)
MCV: 90.1 fL (ref 80.0–100.0)
MPV: 12 fL (ref 7.5–12.5)
Monocytes Relative: 9.7 %
Neutro Abs: 2051 cells/uL (ref 1500–7800)
Neutrophils Relative %: 52.6 %
Platelets: 221 10*3/uL (ref 140–400)
RBC: 5.27 10*6/uL (ref 4.20–5.80)
RDW: 12.8 % (ref 11.0–15.0)
Total Lymphocyte: 34.4 %
WBC: 3.9 10*3/uL (ref 3.8–10.8)

## 2018-04-07 LAB — HIV ANTIBODY (ROUTINE TESTING W REFLEX): HIV: NONREACTIVE

## 2018-04-07 LAB — PHOSPHORUS: Phosphorus: 3.1 mg/dL (ref 2.5–4.5)

## 2018-04-07 LAB — LIPASE: Lipase: 40 U/L (ref 7–60)

## 2018-04-07 LAB — AMYLASE: Amylase: 53 U/L (ref 21–101)

## 2018-04-07 LAB — CK: Total CK: 136 U/L (ref 44–196)

## 2018-04-21 ENCOUNTER — Encounter (INDEPENDENT_AMBULATORY_CARE_PROVIDER_SITE_OTHER): Payer: Self-pay

## 2018-04-21 VITALS — BP 120/75 | HR 86 | Temp 98.6°F | Wt 193.5 lb

## 2018-04-21 DIAGNOSIS — Z006 Encounter for examination for normal comparison and control in clinical research program: Secondary | ICD-10-CM

## 2018-04-21 NOTE — Research (Signed)
Participant here for [id number] study follow up. He voices no new concerns and no issues with last injection. He feels he is doing well with taking his study medication daily. Today's rapid HIV non-reactive. He is scheduled for next study visit on 06/01/18.

## 2018-04-22 LAB — COMPREHENSIVE METABOLIC PANEL
AG Ratio: 1.7 (calc) (ref 1.0–2.5)
ALT: 36 U/L (ref 9–46)
AST: 27 U/L (ref 10–40)
Albumin: 4.3 g/dL (ref 3.6–5.1)
Alkaline phosphatase (APISO): 53 U/L (ref 40–115)
BUN: 13 mg/dL (ref 7–25)
CO2: 28 mmol/L (ref 20–32)
Calcium: 9.7 mg/dL (ref 8.6–10.3)
Chloride: 103 mmol/L (ref 98–110)
Creat: 1.06 mg/dL (ref 0.60–1.35)
Globulin: 2.5 g/dL (calc) (ref 1.9–3.7)
Glucose, Bld: 81 mg/dL (ref 65–99)
Potassium: 4.2 mmol/L (ref 3.5–5.3)
Sodium: 139 mmol/L (ref 135–146)
Total Bilirubin: 0.5 mg/dL (ref 0.2–1.2)
Total Protein: 6.8 g/dL (ref 6.1–8.1)

## 2018-04-22 LAB — CBC WITH DIFFERENTIAL/PLATELET
Absolute Monocytes: 303 cells/uL (ref 200–950)
Basophils Absolute: 20 cells/uL (ref 0–200)
Basophils Relative: 0.6 %
Eosinophils Absolute: 61 cells/uL (ref 15–500)
Eosinophils Relative: 1.8 %
HCT: 47.9 % (ref 38.5–50.0)
Hemoglobin: 16.3 g/dL (ref 13.2–17.1)
Lymphs Abs: 1319 cells/uL (ref 850–3900)
MCH: 30.4 pg (ref 27.0–33.0)
MCHC: 34 g/dL (ref 32.0–36.0)
MCV: 89.2 fL (ref 80.0–100.0)
MONOS PCT: 8.9 %
MPV: 12 fL (ref 7.5–12.5)
NEUTROS PCT: 49.9 %
Neutro Abs: 1697 cells/uL (ref 1500–7800)
PLATELETS: 220 10*3/uL (ref 140–400)
RBC: 5.37 10*6/uL (ref 4.20–5.80)
RDW: 12.9 % (ref 11.0–15.0)
Total Lymphocyte: 38.8 %
WBC: 3.4 10*3/uL — ABNORMAL LOW (ref 3.8–10.8)

## 2018-04-22 LAB — CK: Total CK: 166 U/L (ref 44–196)

## 2018-04-22 LAB — AMYLASE: Amylase: 52 U/L (ref 21–101)

## 2018-04-22 LAB — LIPASE: Lipase: 37 U/L (ref 7–60)

## 2018-04-22 LAB — HIV ANTIBODY (ROUTINE TESTING W REFLEX): HIV 1&2 Ab, 4th Generation: NONREACTIVE

## 2018-04-22 LAB — PHOSPHORUS: Phosphorus: 3.5 mg/dL (ref 2.5–4.5)

## 2018-06-01 ENCOUNTER — Encounter: Payer: Self-pay | Admitting: *Deleted

## 2018-06-02 ENCOUNTER — Encounter (INDEPENDENT_AMBULATORY_CARE_PROVIDER_SITE_OTHER): Payer: Self-pay | Admitting: *Deleted

## 2018-06-02 VITALS — BP 122/75 | HR 85 | Temp 98.0°F | Wt 191.8 lb

## 2018-06-02 DIAGNOSIS — Z006 Encounter for examination for normal comparison and control in clinical research program: Secondary | ICD-10-CM

## 2018-06-02 NOTE — Research (Signed)
Glenn Rivera is here for his week 121 HPTN 083 visit. No new complaints or concerns. Adherence 100% with oral study medication. Rapid HIV non-reactive. Cabotegravir/placebo injection given (L) glute without problem. He will return in 2 weeks for his next study visit.

## 2018-06-03 LAB — COMPREHENSIVE METABOLIC PANEL
AG Ratio: 1.6 (calc) (ref 1.0–2.5)
ALT: 33 U/L (ref 9–46)
AST: 24 U/L (ref 10–40)
Albumin: 4.1 g/dL (ref 3.6–5.1)
Alkaline phosphatase (APISO): 63 U/L (ref 36–130)
BUN: 14 mg/dL (ref 7–25)
CO2: 30 mmol/L (ref 20–32)
Calcium: 9.5 mg/dL (ref 8.6–10.3)
Chloride: 105 mmol/L (ref 98–110)
Creat: 1.19 mg/dL (ref 0.60–1.35)
Globulin: 2.6 g/dL (calc) (ref 1.9–3.7)
Glucose, Bld: 87 mg/dL (ref 65–99)
Potassium: 4.5 mmol/L (ref 3.5–5.3)
Sodium: 142 mmol/L (ref 135–146)
Total Bilirubin: 0.4 mg/dL (ref 0.2–1.2)
Total Protein: 6.7 g/dL (ref 6.1–8.1)

## 2018-06-03 LAB — PHOSPHORUS: Phosphorus: 3.6 mg/dL (ref 2.5–4.5)

## 2018-06-03 LAB — HIV ANTIBODY (ROUTINE TESTING W REFLEX): HIV 1&2 Ab, 4th Generation: NONREACTIVE

## 2018-06-03 LAB — CK: Total CK: 146 U/L (ref 44–196)

## 2018-06-03 LAB — CBC WITH DIFFERENTIAL/PLATELET
Absolute Monocytes: 281 cells/uL (ref 200–950)
BASOS PCT: 0.8 %
Basophils Absolute: 30 cells/uL (ref 0–200)
Eosinophils Absolute: 152 cells/uL (ref 15–500)
Eosinophils Relative: 4.1 %
HCT: 47.6 % (ref 38.5–50.0)
Hemoglobin: 16.1 g/dL (ref 13.2–17.1)
Lymphs Abs: 1665 cells/uL (ref 850–3900)
MCH: 30.6 pg (ref 27.0–33.0)
MCHC: 33.8 g/dL (ref 32.0–36.0)
MCV: 90.3 fL (ref 80.0–100.0)
MPV: 12.2 fL (ref 7.5–12.5)
Monocytes Relative: 7.6 %
Neutro Abs: 1573 cells/uL (ref 1500–7800)
Neutrophils Relative %: 42.5 %
Platelets: 234 10*3/uL (ref 140–400)
RBC: 5.27 10*6/uL (ref 4.20–5.80)
RDW: 12.7 % (ref 11.0–15.0)
TOTAL LYMPHOCYTE: 45 %
WBC: 3.7 10*3/uL — AB (ref 3.8–10.8)

## 2018-06-03 LAB — AMYLASE: AMYLASE: 126 U/L — AB (ref 21–101)

## 2018-06-03 LAB — LIPASE: Lipase: 357 U/L — ABNORMAL HIGH (ref 7–60)

## 2018-06-03 NOTE — Research (Signed)
Call received from [facility] with abnormal Lipase results. Routing to Research for review.

## 2018-06-08 ENCOUNTER — Encounter: Payer: Self-pay | Admitting: *Deleted

## 2018-06-09 ENCOUNTER — Encounter (INDEPENDENT_AMBULATORY_CARE_PROVIDER_SITE_OTHER): Payer: Self-pay | Admitting: *Deleted

## 2018-06-09 ENCOUNTER — Other Ambulatory Visit: Payer: Self-pay

## 2018-06-09 VITALS — BP 126/86 | HR 85 | Temp 98.0°F | Wt 194.0 lb

## 2018-06-09 DIAGNOSIS — Z006 Encounter for examination for normal comparison and control in clinical research program: Secondary | ICD-10-CM

## 2018-06-10 LAB — CBC WITH DIFFERENTIAL/PLATELET
ABSOLUTE MONOCYTES: 305 {cells}/uL (ref 200–950)
Basophils Absolute: 30 cells/uL (ref 0–200)
Basophils Relative: 0.7 %
Eosinophils Absolute: 69 cells/uL (ref 15–500)
Eosinophils Relative: 1.6 %
HCT: 47.9 % (ref 38.5–50.0)
Hemoglobin: 15.8 g/dL (ref 13.2–17.1)
Lymphs Abs: 1617 cells/uL (ref 850–3900)
MCH: 30.1 pg (ref 27.0–33.0)
MCHC: 33 g/dL (ref 32.0–36.0)
MCV: 91.2 fL (ref 80.0–100.0)
MPV: 12.1 fL (ref 7.5–12.5)
Monocytes Relative: 7.1 %
Neutro Abs: 2279 cells/uL (ref 1500–7800)
Neutrophils Relative %: 53 %
Platelets: 203 10*3/uL (ref 140–400)
RBC: 5.25 10*6/uL (ref 4.20–5.80)
RDW: 12.8 % (ref 11.0–15.0)
TOTAL LYMPHOCYTE: 37.6 %
WBC: 4.3 10*3/uL (ref 3.8–10.8)

## 2018-06-10 LAB — COMPREHENSIVE METABOLIC PANEL
AG Ratio: 1.7 (calc) (ref 1.0–2.5)
ALBUMIN MSPROF: 4.3 g/dL (ref 3.6–5.1)
ALKALINE PHOSPHATASE (APISO): 53 U/L (ref 36–130)
ALT: 33 U/L (ref 9–46)
AST: 21 U/L (ref 10–40)
BUN: 16 mg/dL (ref 7–25)
CO2: 29 mmol/L (ref 20–32)
Calcium: 9.7 mg/dL (ref 8.6–10.3)
Chloride: 101 mmol/L (ref 98–110)
Creat: 1.05 mg/dL (ref 0.60–1.35)
Globulin: 2.6 g/dL (calc) (ref 1.9–3.7)
Glucose, Bld: 89 mg/dL (ref 65–99)
POTASSIUM: 4.1 mmol/L (ref 3.5–5.3)
Sodium: 138 mmol/L (ref 135–146)
Total Bilirubin: 0.4 mg/dL (ref 0.2–1.2)
Total Protein: 6.9 g/dL (ref 6.1–8.1)

## 2018-06-10 LAB — AMYLASE: Amylase: 55 U/L (ref 21–101)

## 2018-06-10 LAB — LIPASE: Lipase: 31 U/L (ref 7–60)

## 2018-06-10 LAB — HIV ANTIBODY (ROUTINE TESTING W REFLEX): HIV 1&2 Ab, 4th Generation: NONREACTIVE

## 2018-06-10 LAB — CK: Total CK: 185 U/L (ref 44–196)

## 2018-06-10 LAB — PHOSPHORUS: Phosphorus: 3.6 mg/dL (ref 2.5–4.5)

## 2018-06-10 NOTE — Research (Signed)
Glenn Rivera is here for his week 123 HPTN 083 visit. Oral study product held since last visit due to elevated lipase. He has been hydrating and avoiding ETOH since visit last week. Denies any abdominal discomfort or symptoms. Will repeat lipase along with other protocol safety labs today. Will evaluate and plan to restart oral study medication once lipase returns to normal range. Denied any injection site reaction. Rapid HIV non-reactive. Next study visit scheduled for 07/27/18.

## 2018-07-27 ENCOUNTER — Other Ambulatory Visit: Payer: Self-pay

## 2018-07-27 ENCOUNTER — Encounter (INDEPENDENT_AMBULATORY_CARE_PROVIDER_SITE_OTHER): Payer: Self-pay

## 2018-07-27 VITALS — BP 124/86 | HR 84 | Temp 98.6°F | Wt 189.5 lb

## 2018-07-27 DIAGNOSIS — Z006 Encounter for examination for normal comparison and control in clinical research program: Secondary | ICD-10-CM

## 2018-07-28 LAB — COMPREHENSIVE METABOLIC PANEL
AG Ratio: 1.7 (calc) (ref 1.0–2.5)
ALT: 21 U/L (ref 9–46)
AST: 20 U/L (ref 10–40)
Albumin: 4.5 g/dL (ref 3.6–5.1)
Alkaline phosphatase (APISO): 53 U/L (ref 36–130)
BUN: 15 mg/dL (ref 7–25)
CO2: 32 mmol/L (ref 20–32)
Calcium: 9.9 mg/dL (ref 8.6–10.3)
Chloride: 103 mmol/L (ref 98–110)
Creat: 1.12 mg/dL (ref 0.60–1.35)
Globulin: 2.6 g/dL (calc) (ref 1.9–3.7)
Glucose, Bld: 93 mg/dL (ref 65–99)
Potassium: 4.3 mmol/L (ref 3.5–5.3)
Sodium: 139 mmol/L (ref 135–146)
Total Bilirubin: 0.7 mg/dL (ref 0.2–1.2)
Total Protein: 7.1 g/dL (ref 6.1–8.1)

## 2018-07-28 LAB — CBC WITH DIFFERENTIAL/PLATELET
Absolute Monocytes: 260 cells/uL (ref 200–950)
Basophils Absolute: 31 cells/uL (ref 0–200)
Basophils Relative: 0.6 %
Eosinophils Absolute: 71 cells/uL (ref 15–500)
Eosinophils Relative: 1.4 %
HCT: 47.4 % (ref 38.5–50.0)
Hemoglobin: 16.1 g/dL (ref 13.2–17.1)
Lymphs Abs: 1719 cells/uL (ref 850–3900)
MCH: 30.7 pg (ref 27.0–33.0)
MCHC: 34 g/dL (ref 32.0–36.0)
MCV: 90.3 fL (ref 80.0–100.0)
MPV: 11.8 fL (ref 7.5–12.5)
Monocytes Relative: 5.1 %
Neutro Abs: 3019 cells/uL (ref 1500–7800)
Neutrophils Relative %: 59.2 %
Platelets: 212 10*3/uL (ref 140–400)
RBC: 5.25 10*6/uL (ref 4.20–5.80)
RDW: 12.4 % (ref 11.0–15.0)
Total Lymphocyte: 33.7 %
WBC: 5.1 10*3/uL (ref 3.8–10.8)

## 2018-07-28 LAB — C. TRACHOMATIS/N. GONORRHOEAE RNA
C. trachomatis RNA, TMA: NOT DETECTED
N. gonorrhoeae RNA, TMA: NOT DETECTED

## 2018-07-28 LAB — CK: Total CK: 196 U/L (ref 44–196)

## 2018-07-28 LAB — HIV ANTIBODY (ROUTINE TESTING W REFLEX): HIV 1&2 Ab, 4th Generation: NONREACTIVE

## 2018-07-28 LAB — RPR: RPR Ser Ql: NONREACTIVE

## 2018-07-28 LAB — LIPASE: Lipase: 39 U/L (ref 7–60)

## 2018-07-28 LAB — AMYLASE: Amylase: 44 U/L (ref 21–101)

## 2018-07-28 LAB — PHOSPHORUS: Phosphorus: 3.3 mg/dL (ref 2.5–4.5)

## 2018-07-28 NOTE — Research (Signed)
Participant here for study [id number] for week 129. Today's rapid HIV non-reactive. He believes he has taken his study medication almost daily since last visit. He received IM injection of cabotegravir/placebo and 90 days of oral IP. He is scheduled for follow up on 5/18.

## 2018-07-29 LAB — CT/NG RNA, TMA RECTAL
Chlamydia Trachomatis RNA: NOT DETECTED
Neisseria Gonorrhoeae RNA: NOT DETECTED

## 2018-08-10 ENCOUNTER — Encounter: Payer: BC Managed Care – PPO | Admitting: *Deleted

## 2018-08-11 ENCOUNTER — Encounter: Payer: BC Managed Care – PPO | Admitting: *Deleted

## 2018-08-12 ENCOUNTER — Other Ambulatory Visit: Payer: Self-pay

## 2018-08-12 ENCOUNTER — Encounter (INDEPENDENT_AMBULATORY_CARE_PROVIDER_SITE_OTHER): Payer: Self-pay | Admitting: *Deleted

## 2018-08-12 VITALS — BP 111/84 | HR 87 | Temp 98.5°F | Wt 195.2 lb

## 2018-08-12 DIAGNOSIS — Z006 Encounter for examination for normal comparison and control in clinical research program: Secondary | ICD-10-CM

## 2018-08-12 NOTE — Research (Signed)
Glenn Rivera is here for his week 131 HPTN 083 visit. Denied any injection site reaction after his last injection. No new complaints or medications. He says that he has been adherent with his oral study medication. Rapid HIV non-reactive. He will return 09/21/18 for his next study visit.

## 2018-08-13 LAB — COMPREHENSIVE METABOLIC PANEL
AG Ratio: 1.6 (calc) (ref 1.0–2.5)
ALT: 31 U/L (ref 9–46)
AST: 26 U/L (ref 10–40)
Albumin: 4.4 g/dL (ref 3.6–5.1)
Alkaline phosphatase (APISO): 52 U/L (ref 36–130)
BUN: 13 mg/dL (ref 7–25)
CO2: 31 mmol/L (ref 20–32)
Calcium: 10 mg/dL (ref 8.6–10.3)
Chloride: 102 mmol/L (ref 98–110)
Creat: 1.23 mg/dL (ref 0.60–1.35)
Globulin: 2.8 g/dL (calc) (ref 1.9–3.7)
Glucose, Bld: 81 mg/dL (ref 65–99)
Potassium: 4.1 mmol/L (ref 3.5–5.3)
Sodium: 140 mmol/L (ref 135–146)
Total Bilirubin: 0.4 mg/dL (ref 0.2–1.2)
Total Protein: 7.2 g/dL (ref 6.1–8.1)

## 2018-08-13 LAB — CBC WITH DIFFERENTIAL/PLATELET
Absolute Monocytes: 239 cells/uL (ref 200–950)
Basophils Absolute: 29 cells/uL (ref 0–200)
Basophils Relative: 0.7 %
Eosinophils Absolute: 130 cells/uL (ref 15–500)
Eosinophils Relative: 3.1 %
HCT: 49.6 % (ref 38.5–50.0)
Hemoglobin: 16.5 g/dL (ref 13.2–17.1)
Lymphs Abs: 1252 cells/uL (ref 850–3900)
MCH: 30.4 pg (ref 27.0–33.0)
MCHC: 33.3 g/dL (ref 32.0–36.0)
MCV: 91.3 fL (ref 80.0–100.0)
MPV: 11.8 fL (ref 7.5–12.5)
Monocytes Relative: 5.7 %
Neutro Abs: 2549 cells/uL (ref 1500–7800)
Neutrophils Relative %: 60.7 %
Platelets: 231 10*3/uL (ref 140–400)
RBC: 5.43 10*6/uL (ref 4.20–5.80)
RDW: 12.4 % (ref 11.0–15.0)
Total Lymphocyte: 29.8 %
WBC: 4.2 10*3/uL (ref 3.8–10.8)

## 2018-08-13 LAB — CK: Total CK: 159 U/L (ref 44–196)

## 2018-08-13 LAB — AMYLASE: Amylase: 43 U/L (ref 21–101)

## 2018-08-13 LAB — HIV ANTIBODY (ROUTINE TESTING W REFLEX): HIV 1&2 Ab, 4th Generation: NONREACTIVE

## 2018-08-13 LAB — PHOSPHORUS: Phosphorus: 3.4 mg/dL (ref 2.5–4.5)

## 2018-08-13 LAB — LIPASE: Lipase: 31 U/L (ref 7–60)

## 2018-09-21 ENCOUNTER — Other Ambulatory Visit: Payer: Self-pay

## 2018-09-21 ENCOUNTER — Encounter (INDEPENDENT_AMBULATORY_CARE_PROVIDER_SITE_OTHER): Payer: Self-pay | Admitting: *Deleted

## 2018-09-21 VITALS — BP 125/79 | HR 88 | Temp 98.1°F | Wt 191.2 lb

## 2018-09-21 DIAGNOSIS — Z006 Encounter for examination for normal comparison and control in clinical research program: Secondary | ICD-10-CM

## 2018-09-22 LAB — CBC WITH DIFFERENTIAL/PLATELET
Absolute Monocytes: 342 cells/uL (ref 200–950)
Basophils Absolute: 32 cells/uL (ref 0–200)
Basophils Relative: 0.7 %
Eosinophils Absolute: 41 cells/uL (ref 15–500)
Eosinophils Relative: 0.9 %
HCT: 49.7 % (ref 38.5–50.0)
Hemoglobin: 16.7 g/dL (ref 13.2–17.1)
Lymphs Abs: 1589 cells/uL (ref 850–3900)
MCH: 30.5 pg (ref 27.0–33.0)
MCHC: 33.6 g/dL (ref 32.0–36.0)
MCV: 90.7 fL (ref 80.0–100.0)
MPV: 11.6 fL (ref 7.5–12.5)
Monocytes Relative: 7.6 %
Neutro Abs: 2498 cells/uL (ref 1500–7800)
Neutrophils Relative %: 55.5 %
Platelets: 228 10*3/uL (ref 140–400)
RBC: 5.48 10*6/uL (ref 4.20–5.80)
RDW: 12.5 % (ref 11.0–15.0)
Total Lymphocyte: 35.3 %
WBC: 4.5 10*3/uL (ref 3.8–10.8)

## 2018-09-22 LAB — COMPREHENSIVE METABOLIC PANEL
AG Ratio: 1.5 (calc) (ref 1.0–2.5)
ALT: 30 U/L (ref 9–46)
AST: 23 U/L (ref 10–40)
Albumin: 4.4 g/dL (ref 3.6–5.1)
Alkaline phosphatase (APISO): 58 U/L (ref 36–130)
BUN: 9 mg/dL (ref 7–25)
CO2: 31 mmol/L (ref 20–32)
Calcium: 10.3 mg/dL (ref 8.6–10.3)
Chloride: 103 mmol/L (ref 98–110)
Creat: 1.16 mg/dL (ref 0.60–1.35)
Globulin: 3 g/dL (calc) (ref 1.9–3.7)
Glucose, Bld: 76 mg/dL (ref 65–99)
Potassium: 4.2 mmol/L (ref 3.5–5.3)
Sodium: 138 mmol/L (ref 135–146)
Total Bilirubin: 0.5 mg/dL (ref 0.2–1.2)
Total Protein: 7.4 g/dL (ref 6.1–8.1)

## 2018-09-22 LAB — HIV ANTIBODY (ROUTINE TESTING W REFLEX): HIV 1&2 Ab, 4th Generation: NONREACTIVE

## 2018-09-22 LAB — CK: Total CK: 206 U/L — ABNORMAL HIGH (ref 44–196)

## 2018-09-22 LAB — PHOSPHORUS: Phosphorus: 4.2 mg/dL (ref 2.5–4.5)

## 2018-09-22 LAB — LIPASE: Lipase: 42 U/L (ref 7–60)

## 2018-09-22 LAB — AMYLASE: Amylase: 50 U/L (ref 21–101)

## 2018-09-22 NOTE — Research (Signed)
Glenn Rivera is here for his week [place] visit. Today's CES-d 10 score was elevated. He states that he has been having some family difficulties. He plans to continue remote visits with his therapist. States the he has good support system with his friends and he is aware that he can reach out to [place] for other resources if needed. Verbalized excellent adherence with his oral study medication. He was un-blinded to study product per protocol. He will continue on open-label truvada at this time but states that he would be interested in the injectable cabotegravir if it becomes available. Rapid HIV non-reactive. He will return in August for his next study visit.

## 2018-11-17 ENCOUNTER — Other Ambulatory Visit: Payer: Self-pay

## 2018-11-17 ENCOUNTER — Encounter (INDEPENDENT_AMBULATORY_CARE_PROVIDER_SITE_OTHER): Payer: Self-pay | Admitting: *Deleted

## 2018-11-17 VITALS — BP 132/87 | HR 86 | Temp 98.0°F | Wt 191.0 lb

## 2018-11-17 DIAGNOSIS — Z006 Encounter for examination for normal comparison and control in clinical research program: Secondary | ICD-10-CM

## 2018-11-18 LAB — HIV ANTIBODY (ROUTINE TESTING W REFLEX): HIV 1&2 Ab, 4th Generation: NONREACTIVE

## 2018-11-18 LAB — CBC WITH DIFFERENTIAL/PLATELET
Absolute Monocytes: 299 cells/uL (ref 200–950)
Basophils Absolute: 21 cells/uL (ref 0–200)
Basophils Relative: 0.5 %
Eosinophils Absolute: 21 cells/uL (ref 15–500)
Eosinophils Relative: 0.5 %
HCT: 46.1 % (ref 38.5–50.0)
Hemoglobin: 15.2 g/dL (ref 13.2–17.1)
Lymphs Abs: 1628 cells/uL (ref 850–3900)
MCH: 30.5 pg (ref 27.0–33.0)
MCHC: 33 g/dL (ref 32.0–36.0)
MCV: 92.4 fL (ref 80.0–100.0)
MPV: 12.1 fL (ref 7.5–12.5)
Monocytes Relative: 7.3 %
Neutro Abs: 2132 cells/uL (ref 1500–7800)
Neutrophils Relative %: 52 %
Platelets: 212 10*3/uL (ref 140–400)
RBC: 4.99 10*6/uL (ref 4.20–5.80)
RDW: 12.8 % (ref 11.0–15.0)
Total Lymphocyte: 39.7 %
WBC: 4.1 10*3/uL (ref 3.8–10.8)

## 2018-11-18 LAB — COMPREHENSIVE METABOLIC PANEL
AG Ratio: 1.7 (calc) (ref 1.0–2.5)
ALT: 20 U/L (ref 9–46)
AST: 21 U/L (ref 10–40)
Albumin: 4.3 g/dL (ref 3.6–5.1)
Alkaline phosphatase (APISO): 53 U/L (ref 36–130)
BUN: 12 mg/dL (ref 7–25)
CO2: 30 mmol/L (ref 20–32)
Calcium: 9.5 mg/dL (ref 8.6–10.3)
Chloride: 103 mmol/L (ref 98–110)
Creat: 1.17 mg/dL (ref 0.60–1.35)
Globulin: 2.6 g/dL (calc) (ref 1.9–3.7)
Glucose, Bld: 85 mg/dL (ref 65–99)
Potassium: 4.3 mmol/L (ref 3.5–5.3)
Sodium: 139 mmol/L (ref 135–146)
Total Bilirubin: 0.5 mg/dL (ref 0.2–1.2)
Total Protein: 6.9 g/dL (ref 6.1–8.1)

## 2018-11-18 LAB — CK: Total CK: 271 U/L — ABNORMAL HIGH (ref 44–196)

## 2018-11-18 LAB — AMYLASE: Amylase: 42 U/L (ref 21–101)

## 2018-11-18 LAB — LIPASE: Lipase: 35 U/L (ref 7–60)

## 2018-11-18 LAB — PHOSPHORUS: Phosphorus: 3.3 mg/dL (ref 2.5–4.5)

## 2018-11-18 NOTE — Research (Signed)
Glenn Rivera is here for his week Glenn Rivera visit. No new complaints. He did not return oral study product but verbalized excellent adherence. Rapid HIV non-reactive. He was unblinded at his last visit and will continue on open-label truvada. He would like to switch to injectable cabotegravir once available. Next study visit scheduled for 01/20/19 at 8:30am.

## 2019-01-20 ENCOUNTER — Other Ambulatory Visit: Payer: Self-pay

## 2019-01-20 ENCOUNTER — Encounter (INDEPENDENT_AMBULATORY_CARE_PROVIDER_SITE_OTHER): Payer: Self-pay | Admitting: *Deleted

## 2019-01-20 VITALS — BP 128/86 | HR 85 | Temp 97.9°F | Wt 193.2 lb

## 2019-01-20 DIAGNOSIS — Z006 Encounter for examination for normal comparison and control in clinical research program: Secondary | ICD-10-CM

## 2019-01-21 LAB — COMPREHENSIVE METABOLIC PANEL
AG Ratio: 1.5 (calc) (ref 1.0–2.5)
ALT: 34 U/L (ref 9–46)
AST: 27 U/L (ref 10–40)
Albumin: 4.5 g/dL (ref 3.6–5.1)
Alkaline phosphatase (APISO): 55 U/L (ref 36–130)
BUN: 11 mg/dL (ref 7–25)
CO2: 31 mmol/L (ref 20–32)
Calcium: 10.1 mg/dL (ref 8.6–10.3)
Chloride: 100 mmol/L (ref 98–110)
Creat: 1.16 mg/dL (ref 0.60–1.35)
Globulin: 3 g/dL (calc) (ref 1.9–3.7)
Glucose, Bld: 80 mg/dL (ref 65–99)
Potassium: 4.3 mmol/L (ref 3.5–5.3)
Sodium: 137 mmol/L (ref 135–146)
Total Bilirubin: 0.6 mg/dL (ref 0.2–1.2)
Total Protein: 7.5 g/dL (ref 6.1–8.1)

## 2019-01-21 LAB — CBC WITH DIFFERENTIAL/PLATELET
Absolute Monocytes: 398 cells/uL (ref 200–950)
Basophils Absolute: 29 cells/uL (ref 0–200)
Basophils Relative: 0.6 %
Eosinophils Absolute: 38 cells/uL (ref 15–500)
Eosinophils Relative: 0.8 %
HCT: 48.8 % (ref 38.5–50.0)
Hemoglobin: 16.4 g/dL (ref 13.2–17.1)
Lymphs Abs: 1618 cells/uL (ref 850–3900)
MCH: 30.5 pg (ref 27.0–33.0)
MCHC: 33.6 g/dL (ref 32.0–36.0)
MCV: 90.9 fL (ref 80.0–100.0)
MPV: 11.7 fL (ref 7.5–12.5)
Monocytes Relative: 8.3 %
Neutro Abs: 2717 cells/uL (ref 1500–7800)
Neutrophils Relative %: 56.6 %
Platelets: 264 10*3/uL (ref 140–400)
RBC: 5.37 10*6/uL (ref 4.20–5.80)
RDW: 12.3 % (ref 11.0–15.0)
Total Lymphocyte: 33.7 %
WBC: 4.8 10*3/uL (ref 3.8–10.8)

## 2019-01-21 LAB — URINALYSIS
Bilirubin Urine: NEGATIVE
Glucose, UA: NEGATIVE
Hgb urine dipstick: NEGATIVE
Nitrite: NEGATIVE
Protein, ur: NEGATIVE
Specific Gravity, Urine: 1.015 (ref 1.001–1.03)
pH: 6 (ref 5.0–8.0)

## 2019-01-21 LAB — C. TRACHOMATIS/N. GONORRHOEAE RNA
C. trachomatis RNA, TMA: NOT DETECTED
N. gonorrhoeae RNA, TMA: NOT DETECTED

## 2019-01-21 LAB — RPR: RPR Ser Ql: NONREACTIVE

## 2019-01-21 LAB — PHOSPHORUS: Phosphorus: 4.5 mg/dL (ref 2.5–4.5)

## 2019-01-21 LAB — HEPATITIS C ANTIBODY
Hepatitis C Ab: NONREACTIVE
SIGNAL TO CUT-OFF: 0.02 (ref <1.00)

## 2019-01-21 LAB — AMYLASE: Amylase: 46 U/L (ref 21–101)

## 2019-01-21 LAB — CK: Total CK: 206 U/L — ABNORMAL HIGH (ref 44–196)

## 2019-01-21 LAB — HIV ANTIBODY (ROUTINE TESTING W REFLEX): HIV 1&2 Ab, 4th Generation: NONREACTIVE

## 2019-01-21 LAB — LIPASE: Lipase: 28 U/L (ref 7–60)

## 2019-01-21 NOTE — Research (Signed)
Glenn Rivera is here for his week [place] visit. No new complaints or concerns. Verbalized excellent adherence with his study medication. He is interested in switching to injectable cabotegravir once available. Rapid HIV non-reactive today. He will return in January for his next study visit.

## 2019-01-23 LAB — CT/NG RNA, TMA RECTAL
Chlamydia Trachomatis RNA: NOT DETECTED
Neisseria Gonorrhoeae RNA: NOT DETECTED

## 2019-04-14 ENCOUNTER — Other Ambulatory Visit: Payer: Self-pay

## 2019-04-14 ENCOUNTER — Encounter (INDEPENDENT_AMBULATORY_CARE_PROVIDER_SITE_OTHER): Payer: Self-pay | Admitting: *Deleted

## 2019-04-14 VITALS — BP 134/85 | HR 99 | Temp 98.2°F | Wt 189.5 lb

## 2019-04-14 DIAGNOSIS — Z006 Encounter for examination for normal comparison and control in clinical research program: Secondary | ICD-10-CM

## 2019-04-14 NOTE — Research (Signed)
Glenn Rivera is here for his step3/week 54 HPTN 083 visit. Verbalized good adherence with his study medication. Feels he may have missed 2-3 doses since his last visit. States that his mother has recently been placed in hospice care.States that he has great support from his friends. We had a long discussion regarding his mothers condition. Emotional support provided. Encouraged him to reach out and schedule some time with his therapist. He agreed. Rapid HIV non-reactive. He will return in April for his next study visit.

## 2019-04-15 LAB — HIV ANTIBODY (ROUTINE TESTING W REFLEX): HIV 1&2 Ab, 4th Generation: NONREACTIVE

## 2019-05-24 ENCOUNTER — Other Ambulatory Visit: Payer: Self-pay

## 2019-05-24 ENCOUNTER — Ambulatory Visit: Payer: BC Managed Care – PPO | Attending: Internal Medicine

## 2019-05-24 DIAGNOSIS — Z23 Encounter for immunization: Secondary | ICD-10-CM

## 2019-05-24 NOTE — Progress Notes (Signed)
   Covid-19 Vaccination Clinic  Name:  Markevion Lattin    MRN: [id number] DOB: 1980-09-30  05/24/2019  Mr. Schwartz was observed post Covid-19 immunization for 15 minutes without incidence. He was provided with Vaccine Information Sheet and instruction to access the V-Safe system.   Mr. Brass was instructed to call 911 with any severe reactions post vaccine: Marland Kitchen Difficulty breathing  . Swelling of your face and throat  . A fast heartbeat  . A bad rash all over your body  . Dizziness and weakness    Immunizations Administered    Name Date Dose VIS Date Route   Pfizer COVID-19 Vaccine 05/24/2019  4:20 PM 0.3 mL 03/05/2019 Intramuscular   Manufacturer: [employer], [employer]   Lot: [id number]   NDC: [address]-1

## 2019-06-16 ENCOUNTER — Ambulatory Visit: Payer: BC Managed Care – PPO | Attending: Internal Medicine

## 2019-06-16 DIAGNOSIS — Z23 Encounter for immunization: Secondary | ICD-10-CM

## 2019-06-16 NOTE — Progress Notes (Signed)
   Covid-19 Vaccination Clinic  Name:  Kerman Pfost    MRN: [id number] DOB: 09/06/1980  06/16/2019  Mr. Clarin was observed post Covid-19 immunization for 15 minutes without incident. He was provided with Vaccine Information Sheet and instruction to access the V-Safe system.   Mr. Kincy was instructed to call 911 with any severe reactions post vaccine: Marland Kitchen Difficulty breathing  . Swelling of face and throat  . A fast heartbeat  . A bad rash all over body  . Dizziness and weakness   Immunizations Administered    Name Date Dose VIS Date Route   Pfizer COVID-19 Vaccine 06/16/2019  4:39 PM 0.3 mL 03/05/2019 Intramuscular   Manufacturer: [employer], [employer]   Lot: [id number]   NDC: [address]-1

## 2019-07-09 ENCOUNTER — Other Ambulatory Visit: Payer: Self-pay

## 2019-07-09 ENCOUNTER — Encounter (INDEPENDENT_AMBULATORY_CARE_PROVIDER_SITE_OTHER): Payer: Self-pay | Admitting: *Deleted

## 2019-07-09 VITALS — BP 145/80 | HR 93 | Temp 99.2°F | Wt 199.1 lb

## 2019-07-09 DIAGNOSIS — Z006 Encounter for examination for normal comparison and control in clinical research program: Secondary | ICD-10-CM

## 2019-07-09 NOTE — Research (Signed)
Glenn Rivera was here for his week 24 visit step 2 for [id number]. He was a bit distressed. He just got terminated from his job yesterday and doesn't understand exactly why. He is interested in the extension for the study. He denies any other new problems or concerns. I asked him about going for counseling and he said he would think about it, worried about his insurance coverage but they did offer him COBRA if he wants it. He will be returning in July.

## 2019-07-10 LAB — C. TRACHOMATIS/N. GONORRHOEAE RNA
C. trachomatis RNA, TMA: NOT DETECTED
N. gonorrhoeae RNA, TMA: NOT DETECTED

## 2019-07-12 LAB — COMPREHENSIVE METABOLIC PANEL
AG Ratio: 1.5 (calc) (ref 1.0–2.5)
ALT: 29 U/L (ref 9–46)
AST: 20 U/L (ref 10–40)
Albumin: 4.3 g/dL (ref 3.6–5.1)
Alkaline phosphatase (APISO): 58 U/L (ref 36–130)
BUN: 15 mg/dL (ref 7–25)
CO2: 28 mmol/L (ref 20–32)
Calcium: 9.9 mg/dL (ref 8.6–10.3)
Chloride: 102 mmol/L (ref 98–110)
Creat: 1.01 mg/dL (ref 0.60–1.35)
Globulin: 2.8 g/dL (calc) (ref 1.9–3.7)
Glucose, Bld: 102 mg/dL — ABNORMAL HIGH (ref 65–99)
Potassium: 4.4 mmol/L (ref 3.5–5.3)
Sodium: 138 mmol/L (ref 135–146)
Total Bilirubin: 0.4 mg/dL (ref 0.2–1.2)
Total Protein: 7.1 g/dL (ref 6.1–8.1)

## 2019-07-12 LAB — CK: Total CK: 215 U/L — ABNORMAL HIGH (ref 44–196)

## 2019-07-12 LAB — PHOSPHORUS: Phosphorus: 3.1 mg/dL (ref 2.5–4.5)

## 2019-07-12 LAB — AMYLASE: Amylase: 49 U/L (ref 21–101)

## 2019-07-12 LAB — HIV ANTIBODY (ROUTINE TESTING W REFLEX): HIV 1&2 Ab, 4th Generation: NONREACTIVE

## 2019-07-12 LAB — RPR: RPR Ser Ql: NONREACTIVE

## 2019-07-12 LAB — LIPASE: Lipase: 38 U/L (ref 7–60)

## 2019-07-14 LAB — CT/NG RNA, TMA RECTAL
Chlamydia Trachomatis RNA: NOT DETECTED
Neisseria Gonorrhoeae RNA: NOT DETECTED

## 2019-10-01 ENCOUNTER — Other Ambulatory Visit: Payer: Self-pay

## 2019-10-01 ENCOUNTER — Encounter (INDEPENDENT_AMBULATORY_CARE_PROVIDER_SITE_OTHER): Payer: Self-pay | Admitting: *Deleted

## 2019-10-01 VITALS — BP 128/88 | HR 85 | Temp 98.2°F | Wt 197.4 lb

## 2019-10-01 DIAGNOSIS — Z006 Encounter for examination for normal comparison and control in clinical research program: Secondary | ICD-10-CM

## 2019-10-01 MED ORDER — STUDY - HPTN 083 (STEP 4, OPEN-LABEL) - CABOTEGRAVIR 600MG/3ML INJECTION (PI-VAN DAM): 600.0000 mg | INJECTION | INTRAMUSCULAR | Status: DC

## 2019-10-01 NOTE — Research (Signed)
Glenn Rivera is here for his HPTN 083 study visit. Informed consent for version 4.0 of the protocol and LOA #1 reviewed/explained. Risk, benefits, responsibilities and options discussed. Verbalized understanding and he signed the informed consents witnessed by me. States that he would like to switch to cabotegravir and go directly to injection. Reviewed possible side effects related to cabotegravir. Rapid HIV non-reactive. Cabotegravir injection given (L) glute without problem. He will return in a month for his next study visit and injection.

## 2019-10-05 LAB — HEPATIC FUNCTION PANEL
AG Ratio: 1.9 (calc) (ref 1.0–2.5)
ALT: 31 U/L (ref 9–46)
AST: 24 U/L (ref 10–40)
Albumin: 4.3 g/dL (ref 3.6–5.1)
Alkaline phosphatase (APISO): 56 U/L (ref 36–130)
Bilirubin, Direct: 0.1 mg/dL (ref 0.0–0.2)
Globulin: 2.3 g/dL (calc) (ref 1.9–3.7)
Indirect Bilirubin: 0.2 mg/dL (calc) (ref 0.2–1.2)
Total Bilirubin: 0.3 mg/dL (ref 0.2–1.2)
Total Protein: 6.6 g/dL (ref 6.1–8.1)

## 2019-10-05 LAB — HIV-1 RNA QUANT-NO REFLEX-BLD
HIV 1 RNA Quant: <20 copies/mL
HIV-1 RNA Quant, Log: <1.3 Log copies/mL

## 2019-10-05 LAB — CREATININE, SERUM: Creat: 1.12 mg/dL (ref 0.60–1.35)

## 2019-10-05 LAB — HIV ANTIBODY (ROUTINE TESTING W REFLEX): HIV 1&2 Ab, 4th Generation: NONREACTIVE

## 2019-10-28 ENCOUNTER — Other Ambulatory Visit: Payer: Self-pay

## 2019-10-28 ENCOUNTER — Encounter (INDEPENDENT_AMBULATORY_CARE_PROVIDER_SITE_OTHER): Payer: Self-pay | Admitting: *Deleted

## 2019-10-28 VITALS — BP 117/79 | HR 88 | Temp 98.2°F | Wt 194.2 lb

## 2019-10-28 DIAGNOSIS — Z006 Encounter for examination for normal comparison and control in clinical research program: Secondary | ICD-10-CM

## 2019-10-28 NOTE — Research (Signed)
Glenn Rivera is here for his Step 4 Week 0 HPTN 083 study visit. States that he did have swelling, tenderness and a knot at the injection site. Tenderness and swelling lasted 4 days, knot still present. No redness or tenderness noted. Encouraged him to try and apply warm moist compress to area to see if that helps the knot resolve. Instructed him to let me know if it does not resolve or if notes redness, swelling, pain and/or drainage from area. States that he would like to continue to receive the cabotegravir injection. Rapid HIV non-reactive today. Cabotegravir injection given (R) glute without problem. He will return in September for his next study visit and injection.

## 2019-11-02 LAB — HEPATIC FUNCTION PANEL
AG Ratio: 1.4 (calc) (ref 1.0–2.5)
ALT: 23 U/L (ref 9–46)
AST: 20 U/L (ref 10–40)
Albumin: 4.2 g/dL (ref 3.6–5.1)
Alkaline phosphatase (APISO): 52 U/L (ref 36–130)
Bilirubin, Direct: 0.1 mg/dL (ref 0.0–0.2)
Globulin: 2.9 g/dL (calc) (ref 1.9–3.7)
Indirect Bilirubin: 0.4 mg/dL (calc) (ref 0.2–1.2)
Total Bilirubin: 0.5 mg/dL (ref 0.2–1.2)
Total Protein: 7.1 g/dL (ref 6.1–8.1)

## 2019-11-02 LAB — HIV-1 RNA QUANT-NO REFLEX-BLD
HIV 1 RNA Quant: <20 Copies/mL
HIV-1 RNA Quant, Log: <1.3 Log cps/mL

## 2019-11-02 LAB — CREATININE, SERUM: Creat: 1.16 mg/dL (ref 0.60–1.35)

## 2019-11-02 LAB — HIV ANTIBODY (ROUTINE TESTING W REFLEX): HIV 1&2 Ab, 4th Generation: NONREACTIVE

## 2019-12-23 ENCOUNTER — Encounter (INDEPENDENT_AMBULATORY_CARE_PROVIDER_SITE_OTHER): Payer: Self-pay | Admitting: *Deleted

## 2019-12-23 ENCOUNTER — Other Ambulatory Visit: Payer: Self-pay

## 2019-12-23 VITALS — BP 135/78 | HR 82 | Temp 97.9°F | Wt 195.1 lb

## 2019-12-23 DIAGNOSIS — Z006 Encounter for examination for normal comparison and control in clinical research program: Secondary | ICD-10-CM

## 2019-12-23 NOTE — Research (Signed)
Glenn Rivera is here for his Step 4C week 8 HPTN 083 study visit. His mother passed away recently. He says that he is still processing everything. He has not seen his therapist recently but agreed that he feels that this would be helpful and plans to schedule an appointment soon. Good family support. He did have some mild tenderness for 3 days after his last injection. No other complaints verbalized. Cabotegravir injection given (R) glute without problem after verifying that his rapid HIV was non-reactive. He will return in November for his next study visit.

## 2019-12-26 LAB — HIV-1 RNA QUANT-NO REFLEX-BLD
HIV 1 RNA Quant: <20 Copies/mL
HIV-1 RNA Quant, Log: <1.3 Log cps/mL

## 2019-12-26 LAB — HIV ANTIBODY (ROUTINE TESTING W REFLEX): HIV 1&2 Ab, 4th Generation: NONREACTIVE

## 2020-01-14 ENCOUNTER — Emergency Department (HOSPITAL_COMMUNITY)
Admission: EM | Admit: 2020-01-14 | Discharge: 2020-01-14 | Disposition: A | Discharge status: Home / Self Care | Payer: BC Managed Care – PPO | Attending: Emergency Medicine | Admitting: Emergency Medicine

## 2020-01-14 ENCOUNTER — Emergency Department (HOSPITAL_COMMUNITY): Payer: BC Managed Care – PPO

## 2020-01-14 ENCOUNTER — Other Ambulatory Visit: Payer: Self-pay

## 2020-01-14 DIAGNOSIS — Z20822 Contact with and (suspected) exposure to covid-19: Secondary | ICD-10-CM | POA: Diagnosis not present

## 2020-01-14 DIAGNOSIS — R059 Cough, unspecified: Secondary | ICD-10-CM | POA: Diagnosis present

## 2020-01-14 LAB — CBC WITH DIFFERENTIAL/PLATELET
Abs Immature Granulocytes: 0.05 10*3/uL (ref 0.00–0.07)
Basophils Absolute: 0.1 10*3/uL (ref 0.0–0.1)
Basophils Relative: 1 %
Eosinophils Absolute: 0 10*3/uL (ref 0.0–0.5)
Eosinophils Relative: 0 %
HCT: 43.3 % (ref 39.0–52.0)
Hemoglobin: 14.3 g/dL (ref 13.0–17.0)
Immature Granulocytes: 1 %
Lymphocytes Relative: 23 %
Lymphs Abs: 2.2 10*3/uL (ref 0.7–4.0)
MCH: 29.5 pg (ref 26.0–34.0)
MCHC: 33 g/dL (ref 30.0–36.0)
MCV: 89.3 fL (ref 80.0–100.0)
Monocytes Absolute: 0.6 10*3/uL (ref 0.1–1.0)
Monocytes Relative: 7 %
Neutro Abs: 6.7 10*3/uL (ref 1.7–7.7)
Neutrophils Relative %: 68 %
Platelets: 379 10*3/uL (ref 150–400)
RBC: 4.85 MIL/uL (ref 4.22–5.81)
RDW: 13.2 % (ref 11.5–15.5)
WBC: 9.6 10*3/uL (ref 4.0–10.5)
nRBC: 0 % (ref 0.0–0.2)

## 2020-01-14 LAB — BASIC METABOLIC PANEL
Anion gap: 12 (ref 5–15)
BUN: 8 mg/dL (ref 6–20)
CO2: 24 mmol/L (ref 22–32)
Calcium: 9.1 mg/dL (ref 8.9–10.3)
Chloride: 100 mmol/L (ref 98–111)
Creatinine, Ser: 1.11 mg/dL (ref 0.61–1.24)
GFR, Estimated: >60 mL/min (ref >60)
Glucose, Bld: 135 mg/dL — ABNORMAL HIGH (ref 70–99)
Potassium: 3.3 mmol/L — ABNORMAL LOW (ref 3.5–5.1)
Sodium: 136 mmol/L (ref 135–145)

## 2020-01-14 LAB — RESPIRATORY PANEL BY RT PCR (FLU A&B, COVID)
Influenza A by PCR: NEGATIVE
Influenza B by PCR: NEGATIVE
SARS Coronavirus 2 by RT PCR: NEGATIVE

## 2020-01-14 MED ORDER — HYDROCOD POLST-CPM POLST ER 10-8 MG/5ML PO SUER: 5.0000 mL | Freq: Two times a day (BID) | ORAL | 0 refills | Status: DC | PRN

## 2020-01-14 NOTE — ED Provider Notes (Signed)
[facility] EMERGENCY DEPARTMENT Provider Note   CSN: [id number] Arrival date & time: 01/14/20  0249     History Chief Complaint  Patient presents with   Cough    Glenn Rivera is a 39 y.o. male.  Patient presents the emergency department for evaluation of ongoing cough.  Patient states that he was sick with a fever and malaise starting about 12/25/2019.  He felt poorly for several days and went to the doctor the following week.  There he was started on treatment for pneumonia with azithromycin.  He was also given symptomatic treatment with steroids, Mucinex, albuterol inhaler, Tessalon Perles.  Patient states that he felt a little bit better but symptoms persisted after he ran out of medication and he went back to his doctor.  He was then placed on doxycycline and had a chest x-ray which per patient report demonstrated a "left lobe" pneumonia.  Patient has been taking medications for cough without much relief.  He has also taken over-the-counter Robitussin.  He feels short of breath with the coughing, however does report going to an event last night where he was dancing.  No significant chest pain.  Coughing is causing difficulty sleeping.  No recent fevers.  He has production of a "metallic tasting mucus".  He reports that his primary care referred him to pulmonology and he has an appointment in November.  No lower extremity swelling or tenderness.  No history of congestive heart failure. Patient denies risk factors for pulmonary embolism including: unilateral leg swelling, history of DVT/PE/other blood clots, use of exogenous hormones, recent immobilizations, recent surgery, recent travel (>4hr segment), malignancy, hemoptysis.          Past Medical History:  Diagnosis Date   Depression 03/25/2010   History   Eczema 2010   intermittent / seasonal on soles of feet bilaterally   Examination of participant or control in clinical research 02/12/2016    Patient  Active Problem List   Diagnosis Date Noted   Examination of participant or control in clinical research 02/12/2016    No past surgical history on file.     No family history on file.  Social History   Tobacco Use   Smoking status: Never Smoker   Smokeless tobacco: Never Used  Substance Use Topics   Alcohol use: Yes    Alcohol/week: 2.0 standard drinks    Types: 2 Shots of liquor per week    Comment: 1x/month   Drug use: No    Home Medications Prior to Admission medications   Medication Sig Start Date End Date Taking? Authorizing Provider  Study - HPTN [phone] (Step 4, open-label) - cabotegravir 600 mg/3 mL intramuscular injection (PI-[place]) Inject 3 mLs (600 mg total) into the muscle every 8 (eight) weeks. 10/01/19   Randall Hiss, MD    Allergies    Lactose intolerance (gi)  Review of Systems   Review of Systems  Constitutional: Negative for chills and fever.  HENT: Negative for rhinorrhea and sore throat.   Eyes: Negative for redness.  Respiratory: Positive for cough and shortness of breath (with cough).   Cardiovascular: Negative for chest pain and leg swelling.  Gastrointestinal: Positive for vomiting (post-tussive emesis). Negative for abdominal pain, diarrhea and nausea.  Genitourinary: Negative for dysuria and hematuria.  Musculoskeletal: Negative for myalgias.  Skin: Negative for rash.  Neurological: Negative for headaches.    Physical Exam Updated Vital Signs BP 131/84 (BP Location: Right Arm)    Pulse 100  Temp 98.8 F (37.1 C) (Oral)    Resp 20    SpO2 98%   Physical Exam Vitals and nursing note reviewed.  Constitutional:      Appearance: He is well-developed.  HENT:     Head: Normocephalic and atraumatic.  Eyes:     General:        Right eye: No discharge.        Left eye: No discharge.     Conjunctiva/sclera: Conjunctivae normal.  Cardiovascular:     Rate and Rhythm: Normal rate and regular rhythm.     Heart sounds: Normal heart  sounds.  Pulmonary:     Effort: Pulmonary effort is normal.     Breath sounds: Normal breath sounds.     Comments: Frequent coughing spells during exam. Abdominal:     Palpations: Abdomen is soft.     Tenderness: There is no abdominal tenderness.  Musculoskeletal:        General: No tenderness.     Cervical back: Normal range of motion and neck supple.     Right lower leg: No edema.  Skin:    General: Skin is warm and dry.  Neurological:     Mental Status: He is alert.     ED Results / Procedures / Treatments   Labs (all labs ordered are listed, but only abnormal results are displayed) Labs Reviewed  BASIC METABOLIC PANEL - Abnormal; Notable for the following components:      Result Value   Potassium 3.3 (*)    Glucose, Bld 135 (*)    All other components within normal limits  RESPIRATORY PANEL BY RT PCR (FLU A&B, COVID)  CBC WITH DIFFERENTIAL/PLATELET    EKG EKG Interpretation  Date/Time:  Friday January 14 2020 03:13:07 EDT Ventricular Rate:  104 PR Interval:  130 QRS Duration: 94 QT Interval:  336 QTC Calculation: 441 R Axis:   71 Text Interpretation: Sinus tachycardia Nonspecific T wave abnormality No prior tracing for comparison No STEMI Confirmed by Alvester Chou [phone]) on 01/14/2020 7:58:53 AM   Radiology DG Chest 2 View  Result Date: 01/14/2020 CLINICAL DATA:  39 year old male with shortness of breath. EXAM: CHEST - 2 VIEW COMPARISON:  None. FINDINGS: Faint bilateral posterior lower lung field densities may represent atelectasis. Atypical infection is not excluded clinical correlation is recommended. No lobar consolidation, pleural effusion, or pneumothorax. The cardiac silhouette is within limits. No acute osseous pathology. IMPRESSION: No focal consolidation.  Probable bilateral atelectasis. Electronically Signed   By: Elgie Collard M.D.   On: 01/14/2020 03:51    Procedures Procedures (including critical care time)  Medications Ordered in  ED Medications - No data to display  ED Course  I have reviewed the triage vital signs and the nursing notes.  Pertinent labs & imaging results that were available during my care of the patient were reviewed by me and considered in my medical decision making (see chart for details).  Patient seen and examined.  Reviewed labs and Covid testing.  Reviewed chest x-ray results.  Vital signs reviewed and are as follows: BP 131/84 (BP Location: Right Arm)    Pulse 100    Temp 98.8 F (37.1 C) (Oral)    Resp 20    SpO2 98%   Patient appears well overall but miserable from his persistent coughing.  We reviewed treatments tried previously.  He has pretty much maxed out treatment for cough suppression, other than narcotic cough syrups.  I discussed this with the patient and  we will try a course of this.  It also may help him sleep better.  We discussed dependence risk.  I encouraged him to continue the doxycycline for the time being.  Encouraged him to follow-up with pulmonology if he is not better.  We discussed signs symptoms to return including development of chest pain or worsening shortness of breath, especially if he cannot exert himself without becoming profoundly short of breath.     MDM Rules/Calculators/A&P                          Patient with persistent bothersome coughing after febrile illness earlier in the month.  Chest x-ray with possible atelectasis versus atypical pneumonia.  Overall he looks well.  Lungs are clear.  Chest x-ray overall is reassuring.  Covid and flu testing negative.  Will give prescription for Tussionex, continue doxycycline.  Strict return instructions as above.  Doubt PE, congestive heart failure at this point.   Final Clinical Impression(s) / ED Diagnoses Final diagnoses:  Cough    Rx / DC Orders ED Discharge Orders         Ordered    chlorpheniramine-HYDROcodone (TUSSIONEX PENNKINETIC ER) 10-8 MG/5ML SUER  Every 12 hours PRN        01/14/20 1146            Renne Crigler, PA-C 01/14/20 1211    Terald Sleeper, MD 01/14/20 1901

## 2020-01-14 NOTE — ED Notes (Signed)
Pt d/c home per MD order. Discharge summary reviewed with pt, pt verbalizes understanding. Ambulatory off unit. No s/s of acute distress noted.  

## 2020-01-14 NOTE — ED Triage Notes (Addendum)
Pt was seen by PCP this week and was dx with pneumonia and pt is saying his SOB and cough is getting worse. Pt said his throat is hurting from coughing more. Pt said tested for covid 2 times and both negative.  No fevers, no chills now. Pt said the mediation his dr has given him is not working

## 2020-01-14 NOTE — Discharge Instructions (Signed)
Please read and follow all provided instructions.  Your diagnoses today include:  1. Cough     Tests performed today include:  Chest x-ray - no definite pneumonia Blood cell counts - were normal Electrolytes and kidney function - no problems  Vital signs. See below for your results today.   Medications prescribed:   Tussinex - narcotic cough suppressant syrup  You have been prescribed narcotic cough suppressant such as Tussinex: DO NOT drive or perform any activities that require you to be awake and alert because this medicine can make you drowsy.   Take any prescribed medications only as directed.  Home care instructions:  Follow any educational materials contained in this packet.  BE VERY CAREFUL not to take multiple medicines containing Tylenol (also called acetaminophen). Doing so can lead to an overdose which can damage your liver and cause liver failure and possibly death.   Follow-up instructions: Please follow-up with your primary care provider in the next 3 days for further evaluation of your symptoms.  Return instructions:   Please return to the Emergency Department if you experience worsening symptoms.   Return with worsening chest pain or difficulty breathing.  Please return if you have any other emergent concerns.  Additional Information:  Your vital signs today were: BP 131/84 (BP Location: Right Arm)   Pulse 100   Temp 98.8 F (37.1 C) (Oral)   Resp 20   SpO2 98%  If your blood pressure (BP) was elevated above 135/85 this visit, please have this repeated by your doctor within one month. --------------

## 2020-02-14 ENCOUNTER — Encounter: Payer: Medicaid Other | Admitting: *Deleted

## 2020-02-29 ENCOUNTER — Encounter (INDEPENDENT_AMBULATORY_CARE_PROVIDER_SITE_OTHER): Payer: Self-pay | Admitting: *Deleted

## 2020-02-29 ENCOUNTER — Other Ambulatory Visit: Payer: Self-pay

## 2020-02-29 ENCOUNTER — Telehealth: Payer: Self-pay | Admitting: Pharmacist

## 2020-02-29 VITALS — BP 143/90 | HR 105 | Temp 98.4°F | Wt 194.5 lb

## 2020-02-29 DIAGNOSIS — J851 Abscess of lung with pneumonia: Secondary | ICD-10-CM

## 2020-02-29 DIAGNOSIS — Z006 Encounter for examination for normal comparison and control in clinical research program: Secondary | ICD-10-CM

## 2020-02-29 DIAGNOSIS — J8489 Other specified interstitial pulmonary diseases: Secondary | ICD-10-CM | POA: Insufficient documentation

## 2020-02-29 NOTE — Telephone Encounter (Signed)
Spoke to pharmacist from [facility] over the phone today regarding patient's vancomycin. He is coming from [place] and she had hardly any information regarding his therapy. His current dose is 1250 mg IV q12h and his last vanc trough was 7.  Discussed with pharmacist. His last SCr was 0.87 and his CrCl is well over 100. A q12h dosing schedule will not get him to a desired trough of ~15, so she will increase dose to 1250 mg IV q8h and recheck a vanc trough at steady state. She asked that we fax orders for pharmacy to dose vanc to [phone]. She will send labs and new orders to our fax number and call the triage line with any issues. Discussed with Arvilla Meres, PA.

## 2020-02-29 NOTE — Progress Notes (Signed)
Subjective:    Patient ID: Glenn Rivera, male    DOB: [birth year], 39 y.o.   MRN: [id number]  Chief Complaint  Patient presents with  . 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  He was recently hospitalized  17 Feb 2020 in 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 He had been treated by pulmonology (Dr. Gerome Apley) in [place], [place] and was being maintained on prednisone 10 mg bid following antimicrobial treatment (azithromycin, ciprofloxacin, doxycycline).  He underwent bronchoscopy and CT scan confirming organizing pneumonia.    He flew to [place], [place] to visit family on 15 Feb 2020.  Despite treatment with prednisone he continued to have night sweats and productive cough.  Symptoms progressed  Upon arrival in [place] with noted "fatigue." On 16 Feb 2020 worsening bodyaches, left chest wall sore and hemoptysis and SOB. On 17 Feb 2020 went to ER with worsening symptoms and fever. CXR revealed parenchymal abscess with effusion and was given single dose of azithromycin and Rocephin IM in ER then started on empiric IV vancomycin and Zosyn, and continued prednisone.  He was also treated with a flutter valve and incentive spirometry. Maintained on room air throughout. Blood culture was negative.   He was discharged with a PICC line  23 Feb 2020 and recommended to continue IV antibiotics until 16 Mar 2020. Scheduled to follow up with Pulmonology (Dr. Frederick Peers)  29 Feb 2020.  This appointment occurred today and repeat CXR was taken with significant decrease in size of lung abcess.  Overall feeling "the best I have felt since Oct 3."  He conintues to have intermittent episodic dry cough with clear/white production in morning only. Denies pleuritic chest pain, SOB, fever, chills, bodyaches, hemoptysis, fatigue, exertional dyspnea,  n/v/d/c.  Next appt with Dr. Gerome Apley is scheduled for 14 Mar 2020.  Current medications he is self administering IV vancomycin 1,250 mg IV Q12h and ertapenim qd.  He is scheduling to establish care at [facility] clinic today.  Allergies  Allergen Reactions  . Lactose Intolerance (Gi) Diarrhea and Other (See Comments)    Diarrhea, upset stomach, and migraine      Outpatient Medications Prior to Visit  Medication Sig Dispense Refill  . chlorpheniramine-HYDROcodone (TUSSIONEX PENNKINETIC ER) 10-8 MG/5ML SUER Take 5 mLs by mouth every 12 (twelve) hours as needed for cough. 140 mL 0  . Study - HPTN 083 (Step 4, open-label) - cabotegravir 600 mg/3 mL intramuscular injection (PI-[place]) Inject 3 mLs (600 mg total) into the muscle every 8 (eight) weeks.     No facility-administered medications prior to visit.     Past Medical History:  Diagnosis Date  . Depression 03/25/2010   History  . Eczema 2010   intermittent / seasonal on soles of feet bilaterally  . Examination of participant or control in clinical research 02/12/2016     No past surgical history on file.     Review of Systems  As noted above    Objective:    There were no vitals taken for this visit. Nursing note and vital signs reviewed.  Physical Exam Constitutional:      General: He is not in acute distress.    Appearance: Normal appearance. He is normal weight. He is not ill-appearing, toxic-appearing or diaphoretic.  HENT:     Head: Normocephalic.  Eyes:     Conjunctiva/sclera: Conjunctivae normal.     Pupils: Pupils are equal, round, and reactive to light.  Cardiovascular:     Rate and Rhythm: Normal rate and regular rhythm.     Pulses: Normal pulses.     Heart sounds: Normal heart sounds. No murmur heard.  No friction rub. No gallop.   Pulmonary:     Effort: Pulmonary effort is normal. No respiratory distress.     Breath sounds: Normal breath sounds. No stridor. No wheezing, rhonchi or rales.  Chest:      Chest wall: No tenderness.  Skin:    General: Skin is warm and dry.     Findings: No rash.  Neurological:     Mental Status: He is alert and oriented to person, place, and time.  Psychiatric:        Mood and Affect: Mood normal.        Behavior: Behavior normal.        Thought Content: Thought content normal.        Judgment: Judgment normal.      No flowsheet data found.     Assessment & Plan:  Scheduled to establish with [facility] to manage treatment for lung abscess consulted with Cassie  Labs drawn today from PICC line for research Reviewed hospital records and pulmonology records Cassie spoke with Morrie Sheldon the pharmacist at [facility] and advised them to Increase vancomycin from bid to tid dosing due to recent trough level of 7 taken on 28 Feb 2020   Patient Active Problem List   Diagnosis Date Noted  . Abscess of left lung with pneumonia (HCC) 02/29/2020  . BOOP (bronchiolitis obliterans with organizing pneumonia) (HCC) 02/29/2020  . Examination of participant or control in clinical research 02/12/2016     Problem List Items Addressed This Visit      Respiratory   Abscess of left lung with pneumonia (HCC)   BOOP (bronchiolitis obliterans with organizing pneumonia) (HCC)    Other Visit Diagnoses    Research study patient    -  Primary   Relevant Orders   HIV antibody (with reflex)   HIV 1 RNA quant-no reflex-bld         No orders of the defined types were placed in this encounter.    Follow-up: No follow-ups on file.

## 2020-03-01 ENCOUNTER — Encounter: Payer: Self-pay | Admitting: Internal Medicine

## 2020-03-01 ENCOUNTER — Telehealth: Payer: Self-pay

## 2020-03-01 ENCOUNTER — Ambulatory Visit (INDEPENDENT_AMBULATORY_CARE_PROVIDER_SITE_OTHER): Payer: BC Managed Care – PPO | Admitting: Internal Medicine

## 2020-03-01 VITALS — BP 123/84 | HR 101 | Temp 97.9°F | Resp 16 | Ht 71.0 in | Wt 193.8 lb

## 2020-03-01 DIAGNOSIS — J851 Abscess of lung with pneumonia: Secondary | ICD-10-CM | POA: Diagnosis not present

## 2020-03-01 MED ORDER — AMOXICILLIN-POT CLAVULANATE 875-125 MG PO TABS: 1.0000 | ORAL_TABLET | Freq: Two times a day (BID) | ORAL | 0 refills | Status: DC

## 2020-03-01 NOTE — Progress Notes (Signed)
[facility]  Patient Active Problem List   Diagnosis Date Noted  . Abscess of left lung with pneumonia ([place]) 02/29/2020  . BOOP (bronchiolitis obliterans with organizing pneumonia) ([place]) 02/29/2020  . Examination of participant or control in clinical research 02/12/2016      Subjective:    Patient ID: Glenn Rivera, male    DOB: Nov 14, 1980, 39 y.o.   MRN: [id number]  Chief Complaint  Patient presents with  . New Patient (Initial Visit)    abcess on L Lung PICC on R arm.     HPI:  Glenn Rivera is a 39 y.o. male current PrEP study, BOOP recently dx'ed and started on predinosone 20 mg daily, complicated by left lung abscess placed on iv abx since 11/25 here for f/u  PrEP study since 2017  azith --> doxy --> cipro no improvement. Bronchoscopy early November dx'ed BOOP pulm Dr [facility] (will get record)  Prednisone didn't change how he feels, still cough, fatigue. Some hoarseness improvement. Coughing blood occasionally  Endorsed weightloss due to poor intake.  No joint pain, rash, muscle weakness/pain, diarrhea, headache,   Patient's story: 10/3 started with fatigue, persistent dry cough, sweating, subjective fever, chill. Other sx include headache Fatigue worsened along with sob No sinus sx Initial covid test negative 10/06; given zpack/tessalon/prednisone taper -- subjective fever resolves but still tired/sob/cough The week after given mostly lack of improvement, now with trace hemoptysis, seen in urgent care again. CXR obtained left side "infiltrate" given doxy -- no change in sx 3 weeks from sx onset seen at [facility] very sob. cxr showed infiltrate left side and atelectasis bilateral. Given hydrocodone Finally seen pulmonology at [facility] 11/10. Just prior to that given cipro and had a ct done showing "45 mm" left sided mass. 11/12 bronchoscopy showed BOOP and on 11/17 started on  prednisone.  He visited [place] and was admitted. W/u culture normal flora; hiv serology/endemic fungal serology and crypto Ag negative.   He followed up pulm 12/07 and pred was decreased to 10 mg daily. He continues on iv vanc/ertapenem   No primary care doc at this time  By the time he started iv abx he was 10-20% of how he feel normally, and now he is 80%. No more sweat, fever, chill, fatigue. Strength much improved. Cough dry and minimal.   Id epi: Born in [place]. Lived in [place] most of his life. Travel to [place] for visit. Been to all endemic fungal area. Been to [place] (lived in [place] for 6 years). 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 Lactose Intolerance (Gi) Diarrhea and Other (See Comments)    Diarrhea, upset stomach, and migraine      Outpatient Medications Prior to Visit  Medication Sig Dispense Refill  . albuterol (VENTOLIN HFA) 108 (90 Base) MCG/ACT inhaler Inhale into the lungs.    . predniSONE (DELTASONE) 10 MG tablet prednisone 10 mg tablets in a dose pack  Take by oral route as directed x 12 days    . Study - HPTN 083 (Step 4, open-label) - cabotegravir 600 mg/3 mL intramuscular injection (PI-[place]) Inject 3 mLs (600 mg total) into the muscle every 8 (eight) weeks.    . chlorpheniramine-HYDROcodone (TUSSIONEX PENNKINETIC ER) 10-8 MG/5ML SUER  Take 5 mLs by mouth every 12 (twelve) hours as needed for cough. 140 mL 0   No facility-administered medications prior to visit.     Past Medical History:  Diagnosis Date  . Depression 03/25/2010   History  . Eczema 2010   intermittent / seasonal on soles of feet bilaterally  . Examination of participant or control in clinical research 02/12/2016      History reviewed. No pertinent surgical history.    History  reviewed. No pertinent family history.    Social History   Socioeconomic History  . Marital status: Single    Spouse name: Not on file  . Number of children: Not on file  . Years of education: Not on file  . Highest education level: Not on file  Occupational History  . Not on file  Tobacco Use  . Smoking status: Never Smoker  . Smokeless tobacco: Never Used  Substance and Sexual Activity  . Alcohol use: Yes    Alcohol/week: 2.0 standard drinks    Types: 2 Shots of liquor per week    Comment: 1x/month  . Drug use: No  . Sexual activity: Yes    Comment: Pansexual  Other Topics Concern  . Not on file  Social History Narrative  . Not on file   Social Determinants of Health   Financial Resource Strain:   . Difficulty of Paying Living Expenses: Not on file  Food Insecurity:   . Worried About Charity fundraiser in the Last Year: Not on file  . Ran Out of Food in the Last Year: Not on file  Transportation Needs:   . Lack of Transportation (Medical): Not on file  . Lack of Transportation (Non-Medical): Not on file  Physical Activity:   . Days of Exercise per Week: Not on file  . Minutes of Exercise per Session: Not on file  Stress:   . Feeling of Stress : Not on file  Social Connections:   . Frequency of Communication with Friends and Family: Not on file  . Frequency of Social Gatherings with Friends and Family: Not on file  . Attends Religious Services: Not on file  . Active Member of Clubs or Organizations: Not on file  . Attends Archivist Meetings: Not on file  . Marital Status: Not on file  Intimate Partner Violence:   . Fear of Current or Ex-Partner: Not on file  . Emotionally Abused: Not on file  . Physically Abused: Not on file  . Sexually Abused: Not on file      Review of Systems   negative 11 point ros unless mentioned above  Objective:    BP 123/84   Pulse (!) 101   Temp 97.9 F (36.6 C)   Resp 16   Ht 5' 11"  (1.803 m)   Wt 193 lb  12.8 oz (87.9 kg)   SpO2 100%   BMI 27.03 kg/m  Nursing note and vital signs reviewed.  Physical Exam Well appearing, occasional cough, conversant HEENT: per; conj clear; eomi; normocephalic Neck supple cv rrr no mrg Lungs clear; normal respiratory effort abd s/nt Ext no edema Skin no rash Neuro cn2-12 intact; normal strenght/reflex; normal gait Psych alert/oriented msk no synovitis or back tenderness      Labs:  Micro:  Serology:  Imaging:  osh [facility] labs 11/12 path boop 11/12 tissue fungal cx penicillium 11/12 tissue cx gpc on stain but no growth  osh labs 11/25 admissions [facility] Urine histo  ag pending on the notes I received Hiv, crypto ag, legionella/strep ag negative bcx negative covid negative Quant gold negative   OSH chest ct 11/25: Left lower lobe air fluid level consolidation 3.2x2.3 cm No pe Trace left effusion Incidental anamolous coronary artery  Assessment & Plan:   Patient Active Problem List   Diagnosis Date Noted  . Abscess of left lung with pneumonia ([place]) 02/29/2020  . BOOP (bronchiolitis obliterans with organizing pneumonia) ([place]) 02/29/2020  . Examination of participant or control in clinical research 02/12/2016     Problem List Items Addressed This Visit      Respiratory   Abscess of left lung with pneumonia ([place]) - Primary   Relevant Medications   albuterol (VENTOLIN HFA) 108 (90 Base) MCG/ACT inhaler   amoxicillin-clavulanate (AUGMENTIN) 875-125 MG tablet   Other Relevant Orders   Procalcitonin   C-reactive protein (Completed)   CBC w/Diff (Completed)   Comp Met (CMET) (Completed)      Problem list: Lung abscess Histologic boop  Abx: 11/30-12/08 ertapenem/vanc 11/25-11/30 vanc/piptazo  39 yo male with several weeks of chest pain/sob/cough/fatigue along with fever/chill failing outpatient abx, s/p bronch 11/16 showing boop on pathology, ultimately dx'ed with left lower lung abscess by  11/25 now much improved on bsAbx. He is here for new patient f/u first visit with [place]  Suspect he has some smoldering bacterial pna that induces BOOP changes, and then unmasked and developed abscess after low dose (65m daily) prednisone by 11/25  Of note, his penicillium on bronch washing on 11/12 is of unclear significance. He has been to [place] so likely been exposed/colonized to it. Potentially could have prior infection latentn now reactivated. But the fact that he is getting better with bacterial abx suggest this is not a player  As he is much improved today (80% of baseline), would transition to oral abx for another 4 weeks   -stop ertapenem/vanc and remove picc -start amox-clav 875 mg po bid; plan 4 weeks with clinical monitoring  -I don't feel strongly he needs MRSA coverage given the lack of growth on culture (would imaging it still growing by 11/25 if was the cause of the abscess  -reevaluate in 7 days; if worsening would add back mrsa coverage -other point to consider if no improvement/resolution is to revisit potential fungal infection  I have discontinued DJustonFerguson's chlorpheniramine-HYDROcodone. I am also having him maintain his (HPTN 083 (Step 4, open-label) cabotegravir), predniSONE, and albuterol.   No orders of the defined types were placed in this encounter.    Follow-up: Return in about 1 week (around 03/08/2020).      TJabier Mutton MSpirofor Infectious Disease [place]-- -- pager   [phone] 03/01/2020, 10:48 AM

## 2020-03-01 NOTE — Telephone Encounter (Addendum)
Spoke to Snelling at [facility] who took verbal order to PULL PICC ASAP. Patient beginning oral abx today.Then faxed back the orders signed by Dr.Vu. Also attempted to call [facility] but got no answer. Faxed Phy. Order signed by Dr.Vu to D/C IV meds effective 03/01/20 as patient is transitioning to oral abx. Also wrote that patient has extra medications that were delivered 02/29/20 that he needs advised on what to do with those meds. Faxed to [phone].

## 2020-03-01 NOTE — Patient Instructions (Signed)
Thank you for seeing [place].  You seem to be doing better with antibiotics. This suggest a component of abscess/pneumonia of the lung  However, you also have penicillium (a fungus) growing in culture on your bronchoscopy. So we'll see how you continue to respond to antibiotics (if you get back to 100%). Start going back to your normal activity/exericse program  We plan to repeat chest ct in a few weeks  Depending on how you feel and what the labs see and also what the CT scan shows, we can decide if we need to think about the penicillium more at that time   Stop IV antibiotics today Start augmentin - plan to go until 1st week of January 2022  See [place] in 7 days with preclinic labs 2 days prior

## 2020-03-01 NOTE — Research (Signed)
Glenn Rivera seen for his Step 4C Week 16 HPTN 083 study visit. Recent hospitalization for lung abscess in [place], [place]. Seen and evaluated by Arvilla Meres PA-C, see note below. Rapid HIV was non-reactive today. Cabotegravir injection given (R) glute without problem. He will return in January for his next study visit and injection.

## 2020-03-01 NOTE — Telephone Encounter (Signed)
[facility] Patient Product/process development scientist completed.    The patient is insured through [employer] and has a 0.00 copay.  We will continue to follow to see if copay assistance is needed.  Clearance Coots, CPhT Specialty Pharmacy Patient [facility] Phone: [phone] Fax:  [phone]

## 2020-03-02 LAB — HIV-1 RNA QUANT-NO REFLEX-BLD
HIV 1 RNA Quant: <20 Copies/mL
HIV-1 RNA Quant, Log: <1.3 Log cps/mL

## 2020-03-02 LAB — HIV ANTIBODY (ROUTINE TESTING W REFLEX): HIV 1&2 Ab, 4th Generation: NONREACTIVE

## 2020-03-06 ENCOUNTER — Other Ambulatory Visit: Payer: Self-pay

## 2020-03-06 ENCOUNTER — Other Ambulatory Visit: Payer: BC Managed Care – PPO

## 2020-03-06 DIAGNOSIS — J851 Abscess of lung with pneumonia: Secondary | ICD-10-CM

## 2020-03-08 ENCOUNTER — Ambulatory Visit: Payer: BC Managed Care – PPO | Admitting: Internal Medicine

## 2020-03-08 ENCOUNTER — Other Ambulatory Visit: Payer: Self-pay

## 2020-03-08 ENCOUNTER — Encounter: Payer: Self-pay | Admitting: Internal Medicine

## 2020-03-08 VITALS — BP 122/76 | HR 110 | Temp 97.7°F | Ht 70.0 in | Wt 194.0 lb

## 2020-03-08 DIAGNOSIS — J851 Abscess of lung with pneumonia: Secondary | ICD-10-CM | POA: Diagnosis not present

## 2020-03-08 NOTE — Patient Instructions (Signed)
You are doing very well  Finish you amoxicillin course  Follow up in 4 weeks with preclinic labs   Will try to order a ct of your chest after the next visit

## 2020-03-08 NOTE — Progress Notes (Signed)
[facility]  Patient Active Problem List   Diagnosis Date Noted  . Abscess of left lung with pneumonia ([facility]) 02/29/2020  . BOOP (bronchiolitis obliterans with organizing pneumonia) ([facility]) 02/29/2020  . Examination of participant or control in clinical research 02/12/2016      Subjective:    Patient ID: Glenn Rivera, male    DOB: 11/04/1980, 39 y.o.   MRN: [id number]  Chief Complaint  Patient presents with  . Follow-up    HPI:  Glenn Rivera is a 39 y.o. male current PrEP study, BOOP recently dx'ed and started on predinosone 20 mg daily, complicated by left lung abscess placed on iv abx since 11/25 here for f/u  12/15 id f/u Patient seen a week ago for lung abscess. He was taking piptazo for 2 week (started in [facility] in [place]) prior to seeing me. There was significant improvement so we switched to amox-clav a week ago. He is here for f/u and continues to do better He has even less cough now Eating well No n/v/diarrhea/rash/fever/chill Patient is 90% of baseline now  Background: -------------- PrEP study since 2017  azith --> doxy --> cipro no improvement. Bronchoscopy early November dx'ed BOOP pulm Dr Den[facility] (will get record)  Prednisone didn't change how he feels, still cough, fatigue. Some hoarseness improvement. Coughing blood occasionally  Endorsed weightloss due to poor intake.  No joint pain, rash, muscle weakness/pain, diarrhea, headache,   Patient's story: 10/3 started with fatigue, persistent dry cough, sweating, subjective fever, chill. Other sx include headache Fatigue worsened along with sob No sinus sx Initial covid test negative 10/06; given zpack/tessalon/prednisone taper -- subjective fever resolves but still tired/sob/cough The week after given mostly lack of improvement, now with trace hemoptysis, seen in urgent care again. CXR obtained left side "infiltrate" given  doxy -- no change in sx 3 weeks from sx onset seen at [facility] very sob. cxr showed infiltrate left side and atelectasis bilateral. Given hydrocodone Finally seen pulmonology at [facility] 11/10. Just prior to that given cipro and had a ct done showing "45 mm" left sided mass. 11/12 bronchoscopy showed BOOP and on 11/17 started on prednisone.  He visited [place] and was admitted. W/u culture normal flora; hiv serology/endemic fungal serology and crypto Ag negative.   He followed up pulm 12/07 and pred was decreased to 10 mg daily. He continues on iv vanc/ertapenem   No primary care doc at this time  By the time he started iv abx he was 10-20% of how he feel normally, and now he is 80%. No more sweat, fever, chill, fatigue. Strength much improved. Cough dry and minimal.   Id epi: Born in [place]. Lived in [place] most of his life. Travel to [place] for visit. Been to all endemic fungal area. Been to [place] (lived in [place] for 6 years). 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 Lactose Intolerance (Gi) Diarrhea and Other (See Comments)    Diarrhea, upset stomach, and migraine      Outpatient Medications Prior to Visit  Medication Sig Dispense Refill  . amoxicillin-clavulanate (AUGMENTIN) 875-125 MG tablet Take 1 tablet by mouth 2 (two) times daily. 60 tablet 0  . predniSONE (DELTASONE) 10 MG tablet prednisone 10 mg tablets in  a dose pack  Take by oral route as directed x 12 days    . Study - HPTN 083 (Step 4, open-label) - cabotegravir 600 mg/3 mL intramuscular injection (PI-[place]) Inject 3 mLs (600 mg total) into the muscle every 8 (eight) weeks.    Marland Kitchen albuterol (VENTOLIN HFA) 108 (90 Base) MCG/ACT inhaler Inhale into the lungs. (Patient not taking: Reported on 03/08/2020)      No facility-administered medications prior to visit.     Past Medical History:  Diagnosis Date  . Depression 03/25/2010   History  . Eczema 2010   intermittent / seasonal on soles of feet bilaterally  . Examination of participant or control in clinical research 02/12/2016      No past surgical history on file.    No family history on file.    Social History   Socioeconomic History  . Marital status: Single    Spouse name: Not on file  . Number of children: Not on file  . Years of education: Not on file  . Highest education level: Not on file  Occupational History  . Not on file  Tobacco Use  . Smoking status: Never Smoker  . Smokeless tobacco: Never Used  Substance and Sexual Activity  . Alcohol use: Yes    Alcohol/week: 2.0 standard drinks    Types: 2 Shots of liquor per week    Comment: 1x/month  . Drug use: No  . Sexual activity: Yes    Comment: Pansexual  Other Topics Concern  . Not on file  Social History Narrative  . Not on file   Social Determinants of Health   Financial Resource Strain: Not on file  Food Insecurity: Not on file  Transportation Needs: Not on file  Physical Activity: Not on file  Stress: Not on file  Social Connections: Not on file  Intimate Partner Violence: Not on file      Review of Systems   negative 11 point ros unless mentioned above  Objective:    BP 122/76   Pulse (!) 110   Temp 97.7 F (36.5 C) (Oral)   Ht 5\' 10"  (1.778 m)   Wt 194 lb (88 kg)   SpO2 97%   BMI 27.84 kg/m  Nursing note and vital signs reviewed.  Physical Exam    no distress, well appearing Heent: Noromcephalic; per; conj clear Neck suple Cv: rrr no mrg Lungs clear; normal respiratory effort abd s/nt Ext no edema Skin no rash Neuro nonfocal Psych alert/oriented    Labs: 12/13 cr 0.95; crp 4.3; cbc 7/14/282 12/07 hiv rna <20  Micro:  Serology:  Imaging:  Assessment & Plan:   Patient Active Problem List   Diagnosis  Date Noted  . Abscess of left lung with pneumonia (HCC) 02/29/2020  . BOOP (bronchiolitis obliterans with organizing pneumonia) ([facility]) 02/29/2020  . Examination of participant or control in clinical research 02/12/2016     Problem List Items Addressed This Visit   None      I am having Faysal Fenoglio maintain his (HPTN [phone] (Step 4, open-label) cabotegravir), predniSONE, albuterol, and amoxicillin-clavulanate.   No orders of the defined types were placed in this encounter.    Follow-up: No follow-ups on file.    #lung abscess Much improved on amox Finish 3 more weeks amox and f/u with preclinic labs  Will get ct chest at that time Return to normal activities gradually    [id number], MD [facility] for Infect[facility] Disease [facility] -- -- pager   [phone] cell 03/08/2020, 3:46 PM

## 2020-03-13 LAB — CBC WITH DIFFERENTIAL/PLATELET
Absolute Monocytes: 334 cells/uL (ref 200–950)
Basophils Absolute: 28 cells/uL (ref 0–200)
Basophils Relative: 0.4 %
Eosinophils Absolute: 64 cells/uL (ref 15–500)
Eosinophils Relative: 0.9 %
HCT: 43.6 % (ref 38.5–50.0)
Hemoglobin: 14.5 g/dL (ref 13.2–17.1)
Lymphs Abs: 1015 cells/uL (ref 850–3900)
MCH: 29.2 pg (ref 27.0–33.0)
MCHC: 33.3 g/dL (ref 32.0–36.0)
MCV: 87.7 fL (ref 80.0–100.0)
MPV: 10.7 fL (ref 7.5–12.5)
Monocytes Relative: 4.7 %
Neutro Abs: 5659 cells/uL (ref 1500–7800)
Neutrophils Relative %: 79.7 %
Platelets: 282 10*3/uL (ref 140–400)
RBC: 4.97 10*6/uL (ref 4.20–5.80)
RDW: 12.7 % (ref 11.0–15.0)
Total Lymphocyte: 14.3 %
WBC: 7.1 10*3/uL (ref 3.8–10.8)

## 2020-03-13 LAB — COMPREHENSIVE METABOLIC PANEL
AG Ratio: 1.1 (calc) (ref 1.0–2.5)
ALT: 42 U/L (ref 9–46)
AST: 20 U/L (ref 10–40)
Albumin: 3.9 g/dL (ref 3.6–5.1)
Alkaline phosphatase (APISO): 66 U/L (ref 36–130)
BUN: 12 mg/dL (ref 7–25)
CO2: 33 mmol/L — ABNORMAL HIGH (ref 20–32)
Calcium: 10 mg/dL (ref 8.6–10.3)
Chloride: 100 mmol/L (ref 98–110)
Creat: 0.95 mg/dL (ref 0.60–1.35)
Globulin: 3.4 g/dL (calc) (ref 1.9–3.7)
Glucose, Bld: 70 mg/dL (ref 65–99)
Potassium: 4.3 mmol/L (ref 3.5–5.3)
Sodium: 140 mmol/L (ref 135–146)
Total Bilirubin: 0.4 mg/dL (ref 0.2–1.2)
Total Protein: 7.3 g/dL (ref 6.1–8.1)

## 2020-03-13 LAB — C-REACTIVE PROTEIN: CRP: 4.3 mg/L (ref <8.0)

## 2020-03-13 LAB — PROCALCITONIN: Procalcitonin: 0.16 ng/mL — ABNORMAL HIGH (ref <0.10)

## 2020-03-29 ENCOUNTER — Other Ambulatory Visit: Payer: BC Managed Care – PPO

## 2020-03-29 ENCOUNTER — Other Ambulatory Visit: Payer: Self-pay

## 2020-03-29 DIAGNOSIS — J851 Abscess of lung with pneumonia: Secondary | ICD-10-CM

## 2020-03-30 LAB — COMPREHENSIVE METABOLIC PANEL
AG Ratio: 1.3 (calc) (ref 1.0–2.5)
ALT: 29 U/L (ref 9–46)
AST: 19 U/L (ref 10–40)
Albumin: 3.9 g/dL (ref 3.6–5.1)
Alkaline phosphatase (APISO): 54 U/L (ref 36–130)
BUN: 10 mg/dL (ref 7–25)
CO2: 29 mmol/L (ref 20–32)
Calcium: 9.3 mg/dL (ref 8.6–10.3)
Chloride: 102 mmol/L (ref 98–110)
Creat: 1.02 mg/dL (ref 0.60–1.35)
Globulin: 3.1 g/dL (calc) (ref 1.9–3.7)
Glucose, Bld: 85 mg/dL (ref 65–99)
Potassium: 4 mmol/L (ref 3.5–5.3)
Sodium: 139 mmol/L (ref 135–146)
Total Bilirubin: 0.4 mg/dL (ref 0.2–1.2)
Total Protein: 7 g/dL (ref 6.1–8.1)

## 2020-03-30 LAB — CBC
HCT: 45.3 % (ref 38.5–50.0)
Hemoglobin: 15.1 g/dL (ref 13.2–17.1)
MCH: 29 pg (ref 27.0–33.0)
MCHC: 33.3 g/dL (ref 32.0–36.0)
MCV: 87.1 fL (ref 80.0–100.0)
MPV: 11.5 fL (ref 7.5–12.5)
Platelets: 267 10*3/uL (ref 140–400)
RBC: 5.2 10*6/uL (ref 4.20–5.80)
RDW: 13.4 % (ref 11.0–15.0)
WBC: 5 10*3/uL (ref 3.8–10.8)

## 2020-03-30 LAB — C-REACTIVE PROTEIN: CRP: 0.8 mg/L (ref <8.0)

## 2020-04-07 ENCOUNTER — Encounter: Payer: Self-pay | Admitting: Internal Medicine

## 2020-04-07 ENCOUNTER — Ambulatory Visit (INDEPENDENT_AMBULATORY_CARE_PROVIDER_SITE_OTHER): Payer: BC Managed Care – PPO | Admitting: Internal Medicine

## 2020-04-07 ENCOUNTER — Other Ambulatory Visit: Payer: Self-pay

## 2020-04-07 VITALS — BP 142/81 | HR 91 | Wt 199.6 lb

## 2020-04-07 DIAGNOSIS — J851 Abscess of lung with pneumonia: Secondary | ICD-10-CM | POA: Diagnosis not present

## 2020-04-07 NOTE — Progress Notes (Signed)
[facility]  Patient Active Problem List   Diagnosis Date Noted  . Abscess of left lung with pneumonia (HCC) 02/29/2020  . BOOP (bronchiolitis obliterans with organizing pneumonia) (HCC) 02/29/2020  . Examination of participant or control in clinical research 02/12/2016      Subjective:    Patient ID: Glenn Rivera, male    DOB: [birth year], 40 y.o.   MRN: [id number]  Chief Complaint  Patient presents with  . Follow-up    Declined condoms; has received booster; reports feeling better    HPI:  Glenn Rivera is a 40 y.o. male current PrEP study, BOOP recently dx'ed and started on predinosone 20 mg daily, complicated by left lung abscess placed on iv abx since 11/25 here for f/u  04/07/2020 id f/u Back to normal Singing/rigorous activity No cough/chest pain/sob No rash/n/v/diarrhea Finished oral abx recently  12/15 id f/u Patient seen a week ago for lung abscess. He was taking piptazo for 2 week (started in [facility] in [place]) prior to seeing me. There was significant improvement so we switched to amox-clav a week ago. He is here for f/u and continues to do better He has even less cough now Eating well No n/v/diarrhea/rash/fever/chill Patient is 90% of baseline now  Background: -------------- PrEP study since 2017  azith --> doxy --> cipro no improvement. Bronchoscopy early November dx'ed BOOP pulm Dr Deniece Portela [facility] (will get record)  Prednisone didn't change how he feels, still cough, fatigue. Some hoarseness improvement. Coughing blood occasionally  Endorsed weightloss due to poor intake.  No joint pain, rash, muscle weakness/pain, diarrhea, headache,   Patient's story: 10/3 started with fatigue, persistent dry cough, sweating, subjective fever, chill. Other sx include headache Fatigue worsened along with sob No sinus sx Initial covid test negative 10/06; given zpack/tessalon/prednisone taper  -- subjective fever resolves but still tired/sob/cough The week after given mostly lack of improvement, now with trace hemoptysis, seen in urgent care again. CXR obtained left side "infiltrate" given doxy -- no change in sx 3 weeks from sx onset seen at [place] very sob. cxr showed infiltrate left side and atelectasis bilateral. Given hydrocodone Finally seen pulmonology at [facility] 11/10. Just prior to that given cipro and had a ct done showing "45 mm" left sided mass. 11/12 bronchoscopy showed BOOP and on 11/17 started on prednisone.  He visited [place] and was admitted. W/u culture normal flora; hiv serology/endemic fungal serology and crypto Ag negative.   He followed up pulm 12/07 and pred was decreased to 10 mg daily. He continues on iv vanc/ertapenem   No primary care doc at this time  By the time he started iv abx he was 10-20% of how he feel normally, and now he is 80%. No more sweat, fever, chill, fatigue. Strength much improved. Cough dry and minimal.   Id epi: Born in [place]. Lived in [place] most of his life. Travel to [place] for visit. Been to all endemic fungal area. Been to [place] (lived in [place] for 6 years). 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 Lactose Intolerance (Gi) Diarrhea and Other (See Comments)    Diarrhea, upset stomach, and migraine      Outpatient Medications Prior to Visit  Medication Sig Dispense Refill  .  albuterol (VENTOLIN HFA) 108 (90 Base) MCG/ACT inhaler Inhale into the lungs. (Patient not taking: No sig reported)    . amoxicillin-clavulanate (AUGMENTIN) 875-125 MG tablet Take 1 tablet by mouth 2 (two) times daily. (Patient not taking: Reported on 04/07/2020) 60 tablet 0  . predniSONE (DELTASONE) 10 MG tablet prednisone 10 mg tablets  in a dose pack  Take by oral route as directed x 12 days (Patient not taking: Reported on 04/07/2020)    . Study - HPTN 083 (Step 4, open-label) - cabotegravir 600 mg/3 mL intramuscular injection (PI-[place]) Inject 3 mLs (600 mg total) into the muscle every 8 (eight) weeks.     No facility-administered medications prior to visit.     Past Medical History:  Diagnosis Date  . Depression 03/25/2010   History  . Eczema 2010   intermittent / seasonal on soles of feet bilaterally  . Examination of participant or control in clinical research 02/12/2016      No past surgical history on file.    No family history on file.    Social History   Socioeconomic History  . Marital status: Single    Spouse name: Not on file  . Number of children: Not on file  . Years of education: Not on file  . Highest education level: Not on file  Occupational History  . Not on file  Tobacco Use  . Smoking status: Never Smoker  . Smokeless tobacco: Never Used  Substance and Sexual Activity  . Alcohol use: Yes    Alcohol/week: 2.0 standard drinks    Types: 2 Shots of liquor per week    Comment: 1x/month  . Drug use: No  . Sexual activity: Yes    Comment: Pansexual  Other Topics Concern  . Not on file  Social History Narrative  . Not on file   Social Determinants of Health   Financial Resource Strain: Not on file  Food Insecurity: Not on file  Transportation Needs: Not on file  Physical Activity: Not on file  Stress: Not on file  Social Connections: Not on file  Intimate Partner Violence: Not on file      Review of Systems   negative 11 point ros unless mentioned above  Objective:    BP (!) 142/81   Pulse 91   Wt 199 lb 9.6 oz (90.5 kg)   BMI 28.64 kg/m  Nursing note and vital signs reviewed.  Physical Exam    no distress, well appearing Heent: Noromcephalic; per; conj clear Neck suple Cv: rrr no mrg Lungs clear; normal respiratory effort abd s/nt Ext no  edema Skin no rash Neuro nonfocal Psych alert/oriented    Labs: 03/29/20 cr 1/02; lft wnl; cbc 5/15/267; crp 0.8  12/13 cr 0.95; crp 4.3; cbc 7/14/282 12/07 hiv rna <20  Micro:  Serology:  Imaging:  Assessment & Plan:   Patient Active Problem List   Diagnosis Date Noted  . Abscess of left lung with pneumonia (HCC) 02/29/2020  . BOOP (bronchiolitis obliterans with organizing pneumonia) (HCC) 02/29/2020  . Examination of participant or control in clinical research 02/12/2016     Problem List Items Addressed This Visit   None   Visit Diagnoses    Abscess of lung with pneumonia, unspecified laterality [facility])    -  Primary   Relevant Orders   CT CHEST W CONTRAST       I am having Glenn Rivera maintain his (HPTN [id number] (Step 4, open-label) cabotegravir), predniSONE, albuterol, and amoxicillin-clavulanate.  No orders of the defined types were placed in this encounter.    Follow-up: Return if symptoms worsen or fail to improve.    #lung abscess Had finished abx 2 weeks ago. He felt fine now, back to singing, normal activities Will review imaging to see if any process hiding around the abscess that needs to be evaluated further  -chest ct with contrast ordered -f/u as needed   Raymondo Band, MD [facility] -- -- pager   [phone] cell 04/07/2020, 10:18 AM

## 2020-04-07 NOTE — Patient Instructions (Signed)
You are doing very well   Will schedule a chest ct with contrast. Please send a mychart f/u message 3-5 days after it is done and we can communicate to see if further follow up is needed

## 2020-04-12 ENCOUNTER — Encounter (INDEPENDENT_AMBULATORY_CARE_PROVIDER_SITE_OTHER): Payer: Self-pay | Admitting: *Deleted

## 2020-04-12 ENCOUNTER — Other Ambulatory Visit: Payer: Self-pay

## 2020-04-12 VITALS — BP 130/88 | HR 86 | Temp 98.3°F | Wt 201.1 lb

## 2020-04-12 DIAGNOSIS — Z006 Encounter for examination for normal comparison and control in clinical research program: Secondary | ICD-10-CM

## 2020-04-12 NOTE — Research (Signed)
Glenn Rivera was here for his step 4c, week 24 visit for [facility] 083. He says he is doing much better and has recovered from his lung problems, he is back to full function and gaining weight back. He finished his prednisone on 1/4 and the augmentin on 1/7.  He denies any injection site reaction from the last shot and received another one today in the lt. Buttock. He will be returning in March for the next visit.

## 2020-04-13 LAB — C. TRACHOMATIS/N. GONORRHOEAE RNA
C. trachomatis RNA, TMA: NOT DETECTED
N. gonorrhoeae RNA, TMA: NOT DETECTED

## 2020-04-13 LAB — CT/NG RNA, TMA RECTAL
Chlamydia Trachomatis RNA: NOT DETECTED
Neisseria Gonorrhoeae RNA: NOT DETECTED

## 2020-04-14 LAB — HIV-1 RNA QUANT-NO REFLEX-BLD
HIV 1 RNA Quant: <20 Copies/mL
HIV-1 RNA Quant, Log: <1.3 Log cps/mL

## 2020-04-14 LAB — CREATININE, SERUM: Creat: 1.06 mg/dL (ref 0.60–1.35)

## 2020-04-14 LAB — HEPATIC FUNCTION PANEL
AG Ratio: 1.4 (calc) (ref 1.0–2.5)
ALT: 20 U/L (ref 9–46)
AST: 19 U/L (ref 10–40)
Albumin: 4.1 g/dL (ref 3.6–5.1)
Alkaline phosphatase (APISO): 59 U/L (ref 36–130)
Bilirubin, Direct: 0.1 mg/dL (ref 0.0–0.2)
Globulin: 3 g/dL (calc) (ref 1.9–3.7)
Indirect Bilirubin: 0.3 mg/dL (calc) (ref 0.2–1.2)
Total Bilirubin: 0.4 mg/dL (ref 0.2–1.2)
Total Protein: 7.1 g/dL (ref 6.1–8.1)

## 2020-04-14 LAB — RPR: RPR Ser Ql: NONREACTIVE

## 2020-04-14 LAB — HIV ANTIBODY (ROUTINE TESTING W REFLEX): HIV 1&2 Ab, 4th Generation: NONREACTIVE

## 2020-04-25 ENCOUNTER — Ambulatory Visit (HOSPITAL_COMMUNITY): Payer: BC Managed Care – PPO

## 2020-05-08 ENCOUNTER — Other Ambulatory Visit: Payer: Self-pay

## 2020-05-08 ENCOUNTER — Telehealth: Payer: Self-pay

## 2020-05-08 NOTE — Telephone Encounter (Signed)
Spoke to [facility] and CT is authorized still even though they changed location to [facility] [address] [place] [address] NPI [id number].   [phone].  Called to schedule at [facility] was told by front desk that it is difficult to reach the primary so they will have to call us back after primary there approves CT  Can be done. I gave them Margarets info and advised Claris Che who called Marchelle Folks at [employer].

## 2020-06-05 ENCOUNTER — Encounter: Payer: BC Managed Care – PPO | Admitting: *Deleted

## 2020-06-06 ENCOUNTER — Other Ambulatory Visit: Payer: Self-pay

## 2020-06-06 ENCOUNTER — Encounter (INDEPENDENT_AMBULATORY_CARE_PROVIDER_SITE_OTHER): Payer: Self-pay | Admitting: *Deleted

## 2020-06-06 VITALS — BP 125/74 | HR 85 | Temp 97.9°F | Wt 204.5 lb

## 2020-06-06 DIAGNOSIS — Z006 Encounter for examination for normal comparison and control in clinical research program: Secondary | ICD-10-CM

## 2020-06-06 NOTE — Research (Signed)
Geral here today for his Step 4C Week 32 HPTN 083 visit. States that he had mild injection site tenderness after his last injection which lasted week and a half. No other complaints verbalized. No new medications. States that he recently had chest CT for follow up of his lung abscess and BOOP. Results still pending. He is scheduled to see Dr. Renold Don on 06/12/20. Rapid HIV was non-reactive today. Cabotegravir Injection given (R) glute without problem. He will return in May for his next study visit.

## 2020-06-08 LAB — HIV ANTIBODY (ROUTINE TESTING W REFLEX): HIV 1&2 Ab, 4th Generation: NONREACTIVE

## 2020-06-08 LAB — HIV-1 RNA QUANT-NO REFLEX-BLD
HIV 1 RNA Quant: NOT DETECTED Copies/mL
HIV-1 RNA Quant, Log: NOT DETECTED Log cps/mL

## 2020-06-12 ENCOUNTER — Other Ambulatory Visit: Payer: Self-pay

## 2020-06-12 ENCOUNTER — Ambulatory Visit: Payer: BC Managed Care – PPO | Admitting: Internal Medicine

## 2020-06-12 VITALS — BP 126/76 | HR 90 | Temp 97.5°F | Wt 200.0 lb

## 2020-06-12 DIAGNOSIS — J8489 Other specified interstitial pulmonary diseases: Secondary | ICD-10-CM | POA: Diagnosis not present

## 2020-06-12 DIAGNOSIS — J851 Abscess of lung with pneumonia: Secondary | ICD-10-CM

## 2020-06-12 NOTE — Patient Instructions (Signed)
You are doing well Please follow up with your lung doctor to review actual imaging of the chest ct. We will get the report and review. If anything concerning we'll give you a call

## 2020-06-12 NOTE — Addendum Note (Signed)
Addended by: Rutha Bouchard T on: 06/12/2020 01:37 PM   Modules accepted: Level of Service

## 2020-06-12 NOTE — Progress Notes (Addendum)
[facility]  Patient Active Problem List   Diagnosis Date Noted  . Abscess of left lung with pneumonia (HCC) 02/29/2020  . BOOP (bronchiolitis obliterans with organizing pneumonia) (HCC) 02/29/2020  . Examination of participant or control in clinical research 02/12/2016      Subjective:    Patient ID: Glenn Rivera, male    DOB: 1980/09/24, 40 y.o.   MRN: [id number]  No chief complaint on file.   HPI:  Glenn Rivera is a 40 y.o. male current PrEP study, BOOP recently dx'ed and started on predinosone 20 mg daily, complicated by left lung abscess placed on iv abx since 11/25 here for f/u  06/12/20 id f/u He is here for f/u lung abscess. Recent ct chest done [facility] which I don't have result for He has no f/c/cough/chest pain/sob He has no weight loss.  He is eating well and has been feeling 100% since the last visit 2 months ago   04/07/2020 id f/u Back to normal Singing/rigorous activity No cough/chest pain/sob No rash/n/v/diarrhea Finished oral abx recently  12/15 id f/u Patient seen a week ago for lung abscess. He was taking piptazo for 2 week (started in [facility] in [place]) prior to seeing me. There was significant improvement so we switched to amox-clav a week ago. He is here for f/u and continues to do better He has even less cough now Eating well No n/v/diarrhea/rash/fever/chill Patient is 90% of baseline now  Background: -------------- PrEP study since 2017  azith --> doxy --> cipro no improvement. Bronchoscopy early November dx'ed BOOP pulm Dr Deniece Portela [facility] (will get record)  Prednisone didn't change how he feels, still cough, fatigue. Some hoarseness improvement. Coughing blood occasionally  Endorsed weightloss due to poor intake.  No joint pain, rash, muscle weakness/pain, diarrhea, headache,   Patient's story: 10/3 started with fatigue, persistent dry cough,  sweating, subjective fever, chill. Other sx include headache Fatigue worsened along with sob No sinus sx Initial covid test negative 10/06; given zpack/tessalon/prednisone taper -- subjective fever resolves but still tired/sob/cough The week after given mostly lack of improvement, now with trace hemoptysis, seen in urgent care again. CXR obtained left side "infiltrate" given doxy -- no change in sx 3 weeks from sx onset seen at [place] very sob. cxr showed infiltrate left side and atelectasis bilateral. Given hydrocodone Finally seen pulmonology at [facility] 11/10. Just prior to that given cipro and had a ct done showing "45 mm" left sided mass. 11/12 bronchoscopy showed BOOP and on 11/17 started on prednisone.  He visited [place] and was admitted. W/u culture normal flora; hiv serology/endemic fungal serology and crypto Ag negative.   He followed up pulm 12/07 and pred was decreased to 10 mg daily. He continues on iv vanc/ertapenem   No primary care doc at this time  By the time he started iv abx he was 10-20% of how he feel normally, and now he is 80%. No more sweat, fever, chill, fatigue. Strength much improved. Cough dry and minimal.   Id epi: Born in [place]. Lived in [place] most of his life. Travel to [place] for visit. Been to all endemic fungal area. Been to [place] (lived in [place] for 6 years). 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 Lactose Intolerance (Gi) Diarrhea and Other (See Comments)    Diarrhea, upset stomach, and migraine      Outpatient Medications Prior to Visit  Medication Sig Dispense Refill  . albuterol (VENTOLIN HFA) 108 (90 Base) MCG/ACT inhaler Inhale into the lungs. (Patient not taking: No sig reported)    . amoxicillin-clavulanate (AUGMENTIN)  875-125 MG tablet Take 1 tablet by mouth 2 (two) times daily. (Patient not taking: Reported on 04/07/2020) 60 tablet 0  . predniSONE (DELTASONE) 10 MG tablet prednisone 10 mg tablets in a dose pack  Take by oral route as directed x 12 days (Patient not taking: Reported on 04/07/2020)    . Study - HPTN 083 (Step 4, open-label) - cabotegravir 600 mg/3 mL intramuscular injection (PI-[place]) Inject 3 mLs (600 mg total) into the muscle every 8 (eight) weeks.     No facility-administered medications prior to visit.     Past Medical History:  Diagnosis Date  . Depression 03/25/2010   History  . Eczema 2010   intermittent / seasonal on soles of feet bilaterally  . Examination of participant or control in clinical research 02/12/2016     Social History   Socioeconomic History  . Marital status: Single    Spouse name: Not on file  . Number of children: Not on file  . Years of education: Not on file  . Highest education level: Not on file  Occupational History  . Not on file  Tobacco Use  . Smoking status: Never Smoker  . Smokeless tobacco: Never Used  Substance and Sexual Activity  . Alcohol use: Yes    Alcohol/week: 2.0 standard drinks    Types: 2 Shots of liquor per week    Comment: 1x/month  . Drug use: No  . Sexual activity: Yes    Comment: Pansexual  Other Topics Concern  . Not on file  Social History Narrative  . Not on file   Social Determinants of Health   Financial Resource Strain: Not on file  Food Insecurity: Not on file  Transportation Needs: Not on file  Physical Activity: Not on file  Stress: Not on file  Social Connections: Not on file  Intimate Partner Violence: Not on file      Review of Systems   negative 11 point ros unless mentioned above  Objective:    There were no vitals taken for this visit. Nursing note and vital signs reviewed.  Physical Exam    no distress, well appearing Heent: Noromcephalic; per; conj clear Neck suple Cv: rrr no  mrg Lungs clear; normal respiratory effort abd s/nt Ext no edema Skin no rash Neuro nonfocal Psych alert/oriented    Labs: 04/12/20 cr 1.06 03/29/20 cr 1.02; lft wnl; cbc 5/15/267; crp 0.8  12/13 cr 0.95; crp 4.3; cbc 7/14/282 12/07 hiv rna <20  Micro:  Serology:  Imaging:  Assessment & Plan:   Patient Active Problem List   Diagnosis Date Noted  . Abscess of left lung with pneumonia (HCC) 02/29/2020  . BOOP (bronchiolitis obliterans with organizing pneumonia) (HCC) 02/29/2020  . Examination of participant or control in clinical research 02/12/2016     Problem List Items Addressed This Visit      Respiratory   Abscess of left lung with pneumonia (HCC) - Primary   BOOP (bronchiolitis obliterans with organizing pneumonia) (HCC)      Patient finished abx piptazo--> augmentin first week 03/2020 for total 4 weeks. Doing very well clinically. I had him repeat  chest ct to r/o obstructive mass or hiding mass. He had it done at [facility] and I don't have result   He continues to be in Appretude study  -f/u lung doc to review images -f/u with ID clinic as needed if recurrent fever/cough   Follow-up: Return if symptoms worsen or fail to improve.  Outside [facility] chest ct received via fax, and reviewed Resolution of left lower lobe abscess with minimal residual scarring present   I spent more than 20 minute reviewing data/chart, and coordinating care and >50% direct face to face time providing counseling/discussing diagnostics/treatment plan with patient    Raymondo Band, MD [facility] -- -- pager   [phone] cell 06/12/2020, 8:49 AM

## 2020-08-02 ENCOUNTER — Other Ambulatory Visit: Payer: Self-pay

## 2020-08-02 ENCOUNTER — Encounter (INDEPENDENT_AMBULATORY_CARE_PROVIDER_SITE_OTHER): Payer: Self-pay | Admitting: *Deleted

## 2020-08-02 VITALS — BP 115/76 | HR 105 | Temp 99.3°F | Wt 199.0 lb

## 2020-08-02 DIAGNOSIS — Z006 Encounter for examination for normal comparison and control in clinical research program: Secondary | ICD-10-CM

## 2020-08-02 NOTE — Research (Signed)
Glenn Rivera was here for week 40 for the HPTN 083 study. He denies any new problems or medications. He said he saw pulmonology on 5/9 and they scanned his lungs and said his lung problem had completely resolved. He did report some mild tenderness at the last injection site and he was given a new injection of cabotegravir in his rt glut. At this visit. He will return in 8 weeks.

## 2020-08-06 LAB — HIV-1 RNA QUANT-NO REFLEX-BLD
HIV 1 RNA Quant: NOT DETECTED Copies/mL
HIV-1 RNA Quant, Log: NOT DETECTED Log cps/mL

## 2020-08-06 LAB — HIV ANTIBODY (ROUTINE TESTING W REFLEX): HIV 1&2 Ab, 4th Generation: NONREACTIVE

## 2020-09-13 ENCOUNTER — Other Ambulatory Visit (HOSPITAL_COMMUNITY): Payer: Self-pay

## 2020-09-26 ENCOUNTER — Encounter (INDEPENDENT_AMBULATORY_CARE_PROVIDER_SITE_OTHER): Payer: Self-pay | Admitting: *Deleted

## 2020-09-26 ENCOUNTER — Other Ambulatory Visit: Payer: Self-pay

## 2020-09-26 VITALS — BP 136/88 | HR 87 | Temp 98.0°F | Wt 196.4 lb

## 2020-09-26 DIAGNOSIS — Z006 Encounter for examination for normal comparison and control in clinical research program: Secondary | ICD-10-CM

## 2020-09-26 NOTE — Research (Signed)
Glenn Rivera was here for his week 48, step 4c visit today. He said the last injection was fine, no problems. He got another injection today of cabotegravir in his left buttock. He did test positive for covid around 6/20 after a friend he was around notified him he was positive. He only had very mild symptoms.  He will return in 8 weeks for the next injection.

## 2020-09-27 LAB — CT/NG RNA, TMA RECTAL
Chlamydia Trachomatis RNA: NOT DETECTED
Neisseria Gonorrhoeae RNA: NOT DETECTED

## 2020-09-27 LAB — C. TRACHOMATIS/N. GONORRHOEAE RNA
C. trachomatis RNA, TMA: NOT DETECTED
N. gonorrhoeae RNA, TMA: NOT DETECTED

## 2020-09-28 LAB — HEPATIC FUNCTION PANEL
AG Ratio: 1.4 (calc) (ref 1.0–2.5)
ALT: 15 U/L (ref 9–46)
AST: 17 U/L (ref 10–40)
Albumin: 4.4 g/dL (ref 3.6–5.1)
Alkaline phosphatase (APISO): 59 U/L (ref 36–130)
Bilirubin, Direct: 0.1 mg/dL (ref 0.0–0.2)
Globulin: 3.1 g/dL (calc) (ref 1.9–3.7)
Indirect Bilirubin: 0.5 mg/dL (calc) (ref 0.2–1.2)
Total Bilirubin: 0.6 mg/dL (ref 0.2–1.2)
Total Protein: 7.5 g/dL (ref 6.1–8.1)

## 2020-09-28 LAB — HIV ANTIBODY (ROUTINE TESTING W REFLEX): HIV 1&2 Ab, 4th Generation: NONREACTIVE

## 2020-09-28 LAB — HEPATITIS C ANTIBODY
Hepatitis C Ab: NONREACTIVE
SIGNAL TO CUT-OFF: 0.02 (ref <1.00)

## 2020-09-28 LAB — RPR: RPR Ser Ql: NONREACTIVE

## 2020-09-28 LAB — HIV-1 RNA QUANT-NO REFLEX-BLD
HIV 1 RNA Quant: NOT DETECTED {copies}/mL
HIV-1 RNA Quant, Log: NOT DETECTED {Log_copies}/mL

## 2020-09-28 LAB — CREATININE, SERUM: Creat: 1.08 mg/dL (ref 0.60–1.35)

## 2020-10-23 ENCOUNTER — Other Ambulatory Visit (HOSPITAL_COMMUNITY): Payer: Self-pay

## 2020-10-23 ENCOUNTER — Telehealth: Payer: Self-pay

## 2020-10-23 NOTE — Telephone Encounter (Addendum)
[facility] Patient Advocate Encounter  Apretude is covered under the patient medical benefits, medication do not need a PA or precert. Patient will have a office visit of $20.00.  I enrolled the patient in the [employer] portal.         Clearance Coots, CPhT Specialty Pharmacy Patient [facility] Phone: [phone] Fax:  [phone]

## 2020-11-15 ENCOUNTER — Other Ambulatory Visit: Payer: Self-pay

## 2020-11-21 ENCOUNTER — Other Ambulatory Visit: Payer: Self-pay | Admitting: Pharmacist

## 2020-11-21 DIAGNOSIS — Z113 Encounter for screening for infections with a predominantly sexual mode of transmission: Secondary | ICD-10-CM

## 2020-11-21 DIAGNOSIS — Z79899 Other long term (current) drug therapy: Secondary | ICD-10-CM

## 2020-11-22 ENCOUNTER — Ambulatory Visit: Payer: BC Managed Care – PPO | Admitting: Pharmacist

## 2020-11-22 ENCOUNTER — Other Ambulatory Visit: Payer: BC Managed Care – PPO

## 2020-11-29 ENCOUNTER — Other Ambulatory Visit: Payer: BC Managed Care – PPO

## 2020-11-29 ENCOUNTER — Other Ambulatory Visit (HOSPITAL_COMMUNITY)
Admission: RE | Admit: 2020-11-29 | Discharge: 2020-11-29 | Disposition: A | Discharge status: Home / Self Care | Payer: BC Managed Care – PPO | Source: Ambulatory Visit | Attending: Internal Medicine | Admitting: Internal Medicine

## 2020-11-29 ENCOUNTER — Other Ambulatory Visit: Payer: Self-pay

## 2020-11-29 ENCOUNTER — Ambulatory Visit (INDEPENDENT_AMBULATORY_CARE_PROVIDER_SITE_OTHER): Payer: BC Managed Care – PPO | Admitting: Pharmacist

## 2020-11-29 DIAGNOSIS — Z79899 Other long term (current) drug therapy: Secondary | ICD-10-CM | POA: Diagnosis present

## 2020-11-29 DIAGNOSIS — Z23 Encounter for immunization: Secondary | ICD-10-CM | POA: Diagnosis not present

## 2020-11-29 DIAGNOSIS — Z113 Encounter for screening for infections with a predominantly sexual mode of transmission: Secondary | ICD-10-CM

## 2020-11-29 MED ORDER — APRETUDE 600 MG/3ML IM SUER
600.0000 mg | INTRAMUSCULAR | 0 refills | Status: DC
Start: 2020-11-29 — End: 2023-02-04

## 2020-11-29 MED ORDER — CABOTEGRAVIR ER 600 MG/3ML IM SUER
600.0000 mg | Freq: Once | INTRAMUSCULAR | Status: AC
  Administered 2020-11-29: 600 mg via INTRAMUSCULAR

## 2020-11-29 NOTE — Progress Notes (Signed)
   11/29/2020  HPI: Glenn Rivera is a 40 y.o. male who presents to the [facility] pharmacy clinic for monkeypox immunization.   [name] was observed post immunization for 15 minutes without incident. He was provided with Vaccine Information Sheet and instruction to access the V-Safe system.   [name] was instructed to call 911 with any severe reactions post vaccine: Difficulty breathing  Swelling of face and throat  A fast heartbeat  A bad rash all over body  Dizziness and weakness   Margarite Gouge, PharmD, CPP Clinical Pharmacist Practitioner Infectious Diseases Clinical Pharmacist [facility]  11/29/2020, 12:04 PM

## 2020-11-29 NOTE — Progress Notes (Signed)
HPI: Glenn Rivera is a 40 y.o. male who presents to the [facility] pharmacy clinic for Apretude administration and HIV PrEP follow up.  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   AMOXICILLIN-CLAVULANATE (AUGMENTIN) 875-125 MG TABLET    Take 1 tablet by mouth 2 (two) times daily.   PREDNISONE (DELTASONE) 10 MG TABLET    prednisone 10 mg tablets in a dose pack  Take by oral route as directed x 12 days   STUDY - HPTN 083 (STEP 4, OPEN-LABEL) - CABOTEGRAVIR 600 MG/3 ML INTRAMUSCULAR INJECTION (PI-VAN DAM)    Inject 3 mLs (600 mg total) into the muscle every 8 (eight) weeks.  Modified Medications   No medications on file  Discontinued Medications   No medications on file    Allergies: Allergies  Allergen Reactions   Lactose Intolerance (Gi) Diarrhea and Other (See Comments)    Diarrhea, upset stomach, and migraine    Past Medical History: Past Medical History:  Diagnosis Date   Depression 03/25/2010   History   Eczema 2010   intermittent / seasonal on soles of feet bilaterally   Examination of participant or control in clinical research 02/12/2016    Social History: Social History   Socioeconomic History   Marital status: Single    Spouse name: Not on file   Number of children: Not on file   Years of education: Not on file   Highest education level: Not on file  Occupational History   Not on file  Tobacco Use   Smoking status: Never   Smokeless tobacco: Never  Substance and Sexual Activity   Alcohol use: Yes    Alcohol/week: 2.0 standard drinks    Types: 2 Shots of liquor per week     Comment: 1x/month   Drug use: No   Sexual activity: Yes    Comment: Pansexual  Other Topics Concern   Not on file  Social History Narrative   Not on file   Social Determinants of Health   Financial Resource Strain: Not on file  Food Insecurity: Not on file  Transportation Needs: Not on file  Physical Activity: Not on file  Stress: Not on file  Social Connections: Not on file    Labs: Lab Results  Component Value Date   HIV1RNAQUANT Not Detected 09/26/2020   HIV1RNAQUANT Not Detected 08/02/2020   HIV1RNAQUANT Not Detected 06/06/2020    RPR and STI Lab Results  Component Value Date   LABRPR NON-REACTIVE 09/26/2020   LABRPR NON-REACTIVE 04/12/2020   LABRPR NON-REACTIVE 07/09/2019   LABRPR NON-REACTIVE 01/20/2019   LABRPR NON-REACTIVE 07/27/2018    STI Results GC GC CT CT  03/27/2018 **POSITIVE**(A) - Negative -  10/02/2016 - NOT DETECTED - NOT DETECTED  02/12/2016 - NOT DETECTED - NOT DETECTED    Hepatitis B Lab Results  Component Value Date   HEPBSAB NEG 02/12/2016   HEPBSAG NEGATIVE 02/01/2016   HEPBCAB NON REACTIVE 02/12/2016   Hepatitis C Lab Results  Component Value Date   HEPCAB NON-REACTIVE 09/26/2020   Hepatitis A No results found for: HAV Lipids: Lab Results  Component Value  Date   CHOL 133 02/09/2018   TRIG 46 02/09/2018   HDL 60 02/09/2018   CHOLHDL 2.2 02/09/2018   VLDL 8 02/12/2016   LDLCALC 60 02/09/2018    TARGET DATE: The 7th of the month  Current PrEP Regimen: Apretude  Assessment: Glenn Rivera presents today for their maintenance injection of Apretude and to follow up for HIV PrEP. He is transitioning from the HPTN study with his last injection administered on 09/26/20. States he tolerates the injection well with only a little soreness for 24 hours or so.   Counseled that Apretude is one intramuscular injection in the gluteal muscle for each visit. Explained that the second injection is 30 days after the initial injection then every  2 months thereafter. Discussed follow up appointments moving forward. It is required to have a negative HIV test immediately prior to injection administration. A rapid HIV blood test will be drawn each visit, and patient must wait for results before getting injection, which typically takes 20-30 minutes. Will make lab appointments for each injection 30 minutes prior to injection appointment. Screened patient for acute HIV symptoms such as fatigue, muscle aches, rash, sore throat, lymphadenopathy, headache, night sweats, nausea/vomiting/diarrhea, and fever. Patient denies any symptoms.   Explained that showing up to injection appointments is very important and warned that if appointments are missed, protection will be minimal and the risk of acquiring HIV becomes much higher. Counseled on possible side effects associated with the injections such as injection site pain, which is usually mild to moderate in nature, injection site nodules, and injection site reactions. Asked to call the clinic or send me a mychart message if they experience any issues. Advised that they can take Motrin or Tylenol for injection site pain if needed. They may also pre-treat with Motrin or Tylenol about 30-45 minutes before scheduled appointments.   Administered cabotegravir 600mg /19mL in right upper outer quadrant of the gluteal muscle. Monitored patient for 10 minutes after injection. Injections were tolerated well without issue. Will make follow up appointments for second initiation injection in 30 days and then maintenance injections every 2 months thereafter.   Glenn Rivera requested STI screening today, so will check urine/rectal/oral cytologies. States he has had ~10 partners in the last 3 months and does not use condoms. Patient also stated he is due for his second Jynneos vaccine today. Confirmed with [facility] and administered his second shot. Will follow-up with him in 2 months for his maintenance injections.  Plan: - Administered  maintenance injection  - Maintenance injections scheduled for 11/9, 1/4, and 3/1 with me - Call with any issues or questions - Check urine/rectal/oral cytologies - Administer second Jynneos vaccine today  13/9, PharmD, CPP Clinical Pharmacist Practitioner Infectious Diseases Clinical Pharmacist [facility]

## 2020-11-30 LAB — CYTOLOGY, (ORAL, ANAL, URETHRAL) ANCILLARY ONLY
Chlamydia: NEGATIVE
Chlamydia: NEGATIVE
Comment: NEGATIVE
Comment: NEGATIVE
Comment: NORMAL
Comment: NORMAL
Neisseria Gonorrhea: NEGATIVE
Neisseria Gonorrhea: NEGATIVE

## 2020-11-30 LAB — URINE CYTOLOGY ANCILLARY ONLY
Chlamydia: NEGATIVE
Comment: NEGATIVE
Comment: NORMAL
Neisseria Gonorrhea: NEGATIVE

## 2021-01-25 ENCOUNTER — Other Ambulatory Visit: Payer: Self-pay | Admitting: Pharmacist

## 2021-01-25 DIAGNOSIS — Z79899 Other long term (current) drug therapy: Secondary | ICD-10-CM

## 2021-01-25 DIAGNOSIS — B20 Human immunodeficiency virus [HIV] disease: Secondary | ICD-10-CM

## 2021-01-31 ENCOUNTER — Other Ambulatory Visit (HOSPITAL_COMMUNITY)
Admission: RE | Admit: 2021-01-31 | Discharge: 2021-01-31 | Disposition: A | Discharge status: Home / Self Care | Payer: BC Managed Care – PPO | Source: Ambulatory Visit | Attending: Internal Medicine | Admitting: Internal Medicine

## 2021-01-31 ENCOUNTER — Other Ambulatory Visit: Payer: BC Managed Care – PPO

## 2021-01-31 ENCOUNTER — Other Ambulatory Visit: Payer: Self-pay

## 2021-01-31 ENCOUNTER — Ambulatory Visit (INDEPENDENT_AMBULATORY_CARE_PROVIDER_SITE_OTHER): Payer: BC Managed Care – PPO | Admitting: Pharmacist

## 2021-01-31 DIAGNOSIS — Z79899 Other long term (current) drug therapy: Secondary | ICD-10-CM | POA: Diagnosis present

## 2021-01-31 MED ORDER — CABOTEGRAVIR ER 600 MG/3ML IM SUER
600.0000 mg | Freq: Once | INTRAMUSCULAR | Status: AC
  Administered 2021-01-31: 600 mg via INTRAMUSCULAR

## 2021-01-31 NOTE — Progress Notes (Signed)
HPI: Glenn Rivera is a 40 y.o. male who presents to the [facility] pharmacy clinic for Apretude administration and HIV PrEP follow up.  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   AMOXICILLIN-CLAVULANATE (AUGMENTIN) 875-125 MG TABLET    Take 1 tablet by mouth 2 (two) times daily.   CABOTEGRAVIR ER (APRETUDE) 600 MG/3ML INJECTION    Inject 3 mLs (600 mg total) into the muscle every 2 (two) months.   PREDNISONE (DELTASONE) 10 MG TABLET    prednisone 10 mg tablets in a dose pack  Take by oral route as directed x 12 days  Modified Medications   No medications on file  Discontinued Medications   No medications on file    Allergies: Allergies  Allergen Reactions   Lactose Intolerance (Gi) Diarrhea and Other (See Comments)    Diarrhea, upset stomach, and migraine    Past Medical History: Past Medical History:  Diagnosis Date   Depression 03/25/2010   History   Eczema 2010   intermittent / seasonal on soles of feet bilaterally   Examination of participant or control in clinical research 02/12/2016    Social History: Social History   Socioeconomic History   Marital status: Single    Spouse name: Not on file   Number of children: Not on file   Years of education: Not on file   Highest education level: Not on file  Occupational History   Not on file  Tobacco Use   Smoking status: Never   Smokeless tobacco: Never  Substance and Sexual Activity   Alcohol use: Yes    Alcohol/week: 2.0 standard drinks    Types: 2 Shots of liquor per week    Comment: 1x/month   Drug use: No   Sexual  activity: Yes    Comment: Pansexual  Other Topics Concern   Not on file  Social History Narrative   Not on file   Social Determinants of Health   Financial Resource Strain: Not on file  Food Insecurity: Not on file  Transportation Needs: Not on file  Physical Activity: Not on file  Stress: Not on file  Social Connections: Not on file    Labs: Lab Results  Component Value Date   HIV1RNAQUANT Not Detected 09/26/2020   HIV1RNAQUANT Not Detected 08/02/2020   HIV1RNAQUANT Not Detected 06/06/2020    RPR and STI Lab Results  Component Value Date   LABRPR NON-REACTIVE 09/26/2020   LABRPR NON-REACTIVE 04/12/2020   LABRPR NON-REACTIVE 07/09/2019   LABRPR NON-REACTIVE 01/20/2019   LABRPR NON-REACTIVE 07/27/2018    STI Results GC GC CT CT  11/29/2020 Negative - Negative -  11/29/2020 Negative - Negative -  11/29/2020 Negative - Negative -  03/27/2018 **POSITIVE**(A) - Negative -  10/02/2016 - NOT DETECTED - NOT DETECTED  02/12/2016 - NOT DETECTED - NOT DETECTED    Hepatitis B Lab Results  Component Value Date   HEPBSAB NEG 02/12/2016   HEPBSAG NEGATIVE 02/01/2016   HEPBCAB NON REACTIVE 02/12/2016   Hepatitis C Lab Results  Component Value Date   HEPCAB NON-REACTIVE 09/26/2020   Hepatitis A No results found  for: HAV Lipids: Lab Results  Component Value Date   CHOL 133 02/09/2018   TRIG 46 02/09/2018   HDL 60 02/09/2018   CHOLHDL 2.2 02/09/2018   VLDL 8 02/12/2016   LDLCALC 60 02/09/2018    TARGET DATE: The 7th of the month  Current PrEP Regimen: Apretude  Assessment: Glenn Rivera presents today for their maintenance injection of Apretude and to follow up for HIV PrEP.  Administered cabotegravir 600mg /33mL in Left upper outer quadrant of the gluteal muscle. Will make follow up appointments for maintenance injections every 2 months.   He has not had any issues with past injections. He reports mild soreness post-injections that subsides after 48 hours. I instructed him  to take Tylenol/NSAIDs or apply warm compresses if needed. He does not have any questions or concerns today.   Plan: - Maintenance injections scheduled for 03/28/21 @9 :15, 05/23/21 @9 :15 - Call with any issues or questions  , PharmD PGY2 Infectious Diseases Pharmacy Resident

## 2021-02-01 LAB — URINE CYTOLOGY ANCILLARY ONLY
Chlamydia: NEGATIVE
Comment: NEGATIVE
Comment: NORMAL
Neisseria Gonorrhea: NEGATIVE

## 2021-02-03 LAB — HIV-1 RNA QUANT-NO REFLEX-BLD
HIV 1 RNA Quant: NOT DETECTED Copies/mL
HIV-1 RNA Quant, Log: NOT DETECTED Log cps/mL

## 2021-03-13 ENCOUNTER — Other Ambulatory Visit: Payer: Self-pay | Admitting: Pharmacist

## 2021-03-13 DIAGNOSIS — Z79899 Other long term (current) drug therapy: Secondary | ICD-10-CM

## 2021-03-28 ENCOUNTER — Other Ambulatory Visit: Payer: Self-pay

## 2021-03-28 ENCOUNTER — Other Ambulatory Visit: Payer: BC Managed Care – PPO

## 2021-03-28 ENCOUNTER — Other Ambulatory Visit (HOSPITAL_COMMUNITY)
Admission: RE | Admit: 2021-03-28 | Discharge: 2021-03-28 | Disposition: A | Discharge status: Home / Self Care | Payer: BC Managed Care – PPO | Source: Ambulatory Visit | Attending: Internal Medicine | Admitting: Internal Medicine

## 2021-03-28 ENCOUNTER — Ambulatory Visit (INDEPENDENT_AMBULATORY_CARE_PROVIDER_SITE_OTHER): Payer: BC Managed Care – PPO | Admitting: Pharmacist

## 2021-03-28 DIAGNOSIS — Z79899 Other long term (current) drug therapy: Secondary | ICD-10-CM | POA: Insufficient documentation

## 2021-03-28 DIAGNOSIS — B20 Human immunodeficiency virus [HIV] disease: Secondary | ICD-10-CM | POA: Diagnosis not present

## 2021-03-28 MED ORDER — CABOTEGRAVIR ER 600 MG/3ML IM SUER
600.0000 mg | Freq: Once | INTRAMUSCULAR | Status: AC
  Administered 2021-03-28: 600 mg via INTRAMUSCULAR

## 2021-03-28 NOTE — Progress Notes (Signed)
HPI: Glenn Rivera is a 41 y.o. male who presents to the [facility] pharmacy clinic for Apretude administration and HIV PrEP follow up.  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   AMOXICILLIN-CLAVULANATE (AUGMENTIN) 875-125 MG TABLET    Take 1 tablet by mouth 2 (two) times daily.   CABOTEGRAVIR ER (APRETUDE) 600 MG/3ML INJECTION    Inject 3 mLs (600 mg total) into the muscle every 2 (two) months.   PREDNISONE (DELTASONE) 10 MG TABLET    prednisone 10 mg tablets in a dose pack  Take by oral route as directed x 12 days  Modified Medications   No medications on file  Discontinued Medications   No medications on file    Allergies: Allergies  Allergen Reactions   Lactose Intolerance (Gi) Diarrhea and Other (See Comments)    Diarrhea, upset stomach, and migraine    Past Medical History: Past Medical History:  Diagnosis Date   Depression 03/25/2010   History   Eczema 2010   intermittent / seasonal on soles of feet bilaterally   Examination of participant or control in clinical research 02/12/2016    Social History: Social History   Socioeconomic History   Marital status: Single    Spouse name: Not on file   Number of children: Not on file   Years of education: Not on file   Highest education level: Not on file  Occupational History   Not on file  Tobacco Use   Smoking status: Never   Smokeless tobacco: Never  Substance and Sexual Activity   Alcohol use: Yes    Alcohol/week: 2.0 standard drinks    Types: 2 Shots of liquor per week    Comment: 1x/month   Drug use: No   Sexual  activity: Yes    Comment: Pansexual  Other Topics Concern   Not on file  Social History Narrative   Not on file   Social Determinants of Health   Financial Resource Strain: Not on file  Food Insecurity: Not on file  Transportation Needs: Not on file  Physical Activity: Not on file  Stress: Not on file  Social Connections: Not on file    Labs: Lab Results  Component Value Date   HIV1RNAQUANT Not Detected 01/31/2021   HIV1RNAQUANT Not Detected 09/26/2020   HIV1RNAQUANT Not Detected 08/02/2020    RPR and STI Lab Results  Component Value Date   LABRPR NON-REACTIVE 09/26/2020   LABRPR NON-REACTIVE 04/12/2020   LABRPR NON-REACTIVE 07/09/2019   LABRPR NON-REACTIVE 01/20/2019   LABRPR NON-REACTIVE 07/27/2018    STI Results GC GC CT CT  01/31/2021 Negative - Negative -  11/29/2020 Negative - Negative -  11/29/2020 Negative - Negative -  11/29/2020 Negative - Negative -  03/27/2018 **POSITIVE**(A) - Negative -  10/02/2016 - NOT DETECTED - NOT DETECTED  02/12/2016 - NOT DETECTED - NOT DETECTED    Hepatitis B Lab Results  Component Value Date   HEPBSAB NEG 02/12/2016   HEPBSAG NEGATIVE 02/01/2016   HEPBCAB NON REACTIVE 02/12/2016   Hepatitis C Lab Results  Component Value Date   HEPCAB NON-REACTIVE 09/26/2020  Hepatitis A No results found for: HAV Lipids: Lab Results  Component Value Date   CHOL 133 02/09/2018   TRIG 46 02/09/2018   HDL 60 02/09/2018   CHOLHDL 2.2 02/09/2018   VLDL 8 02/12/2016   LDLCALC 60 02/09/2018    TARGET DATE: The 7th of the month  Current PrEP Regimen: Apretude  Assessment: Glenn Rivera presents today for their maintenance injection of Apretude and to follow up for HIV PrEP. Patient states he experienced extreme soreness for 2 weeks where he felt like he couldn't walk along with mild numbness and tingling. States he experienced cramp from ankle all the way to hip 2 days after his shot but that he is back to normal now. Patient also requested  oral cytology today; will check RPR as well.  Administered cabotegravir 600mg /53mL in right upper outer quadrant of the gluteal muscle. Will make follow up appointments for maintenance injections every 2 months.   Plan: - Maintenance injections scheduled for 3/1 with me - Check urine/oral cytologies and RPR - Call with any issues or questions  1m, PharmD, CPP Clinical Pharmacist Practitioner Infectious Diseases Clinical Pharmacist [facility]

## 2021-03-29 LAB — URINE CYTOLOGY ANCILLARY ONLY
Chlamydia: NEGATIVE
Comment: NEGATIVE
Comment: NORMAL
Neisseria Gonorrhea: NEGATIVE

## 2021-03-29 LAB — CYTOLOGY, (ORAL, ANAL, URETHRAL) ANCILLARY ONLY
Chlamydia: NEGATIVE
Comment: NEGATIVE
Comment: NORMAL
Neisseria Gonorrhea: NEGATIVE

## 2021-03-29 LAB — RPR: RPR Ser Ql: NONREACTIVE

## 2021-03-30 LAB — HIV-1 RNA QUANT-NO REFLEX-BLD
HIV 1 RNA Quant: NOT DETECTED Copies/mL
HIV-1 RNA Quant, Log: NOT DETECTED Log cps/mL

## 2021-05-17 ENCOUNTER — Other Ambulatory Visit: Payer: Self-pay

## 2021-05-17 ENCOUNTER — Ambulatory Visit
Admission: EM | Admit: 2021-05-17 | Discharge: 2021-05-17 | Disposition: A | Discharge status: Home / Self Care | Payer: BC Managed Care – PPO | Attending: Emergency Medicine | Admitting: Emergency Medicine

## 2021-05-17 ENCOUNTER — Ambulatory Visit (HOSPITAL_BASED_OUTPATIENT_CLINIC_OR_DEPARTMENT_OTHER)
Admission: RE | Admit: 2021-05-17 | Discharge: 2021-05-17 | Disposition: A | Discharge status: Home / Self Care | Payer: BC Managed Care – PPO | Source: Ambulatory Visit | Attending: Emergency Medicine | Admitting: Emergency Medicine

## 2021-05-17 DIAGNOSIS — J029 Acute pharyngitis, unspecified: Secondary | ICD-10-CM | POA: Diagnosis present

## 2021-05-17 DIAGNOSIS — R059 Cough, unspecified: Secondary | ICD-10-CM | POA: Insufficient documentation

## 2021-05-17 LAB — POCT RAPID STREP A (OFFICE): Rapid Strep A Screen: NEGATIVE

## 2021-05-17 MED ORDER — AMOXICILLIN-POT CLAVULANATE 875-125 MG PO TABS
1.0000 | ORAL_TABLET | Freq: Two times a day (BID) | ORAL | 0 refills | Status: AC
Start: 2021-05-17 — End: 2021-05-27

## 2021-05-17 NOTE — ED Provider Notes (Signed)
[facility]    CSN: [id number] Arrival date & time: 05/17/21  1504    HISTORY   Chief Complaint  Patient presents with   Cough   Sore Throat   HPI Glenn Rivera is a 41 y.o. male. Patient complains of 5-day history of sore throat, postnasal drip and productive cough.  Vital signs are normal on arrival today.  Patient states that the cough is productive of yellowish sputum, significantly increased this morning.  Patient states the inside of his sinuses, the back of his throat and the inside of his ears all feel itchy and irritated.  EMR reviewed, patient has multiple episodes of pneumonia and abscess of left lower lobe in 2022 as well as bronchial obliterans organizing pneumonia and sepsis secondary to pneumonia in 2021.  The history is provided by the patient.  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 No pertinent surgical history.  Home Medications    Prior to Admission medications   Medication Sig Start Date End Date Taking? Authorizing Provider  albuterol (VENTOLIN HFA) 108 (90 Base) MCG/ACT inhaler Inhale into the lungs. 01/07/20   [provider]  cabotegravir ER (APRETUDE) 600 MG/3ML injection Inject 3 mLs (600 mg total) into the muscle every 2 (two) months. 11/29/20   Jennette Kettle, RPH-CPP   Family History History reviewed. No pertinent family history. Social History Social History   Tobacco Use   Smoking status: Never   Smokeless tobacco: Never  Substance Use Topics   Alcohol use: Yes     Alcohol/week: 2.0 standard drinks    Types: 2 Shots of liquor per week    Comment: 1x/month   Drug use: No   Allergies   Lactose intolerance (gi)  Review of Systems Review of Systems Pertinent findings noted in history of present illness.   Physical Exam Triage Vital Signs ED Triage Vitals  Enc Vitals Group     BP 01/19/21 0827 (!) 147/82     Pulse Rate 01/19/21 0827 72     Resp 01/19/21 0827 18     Temp 01/19/21 0827 98.3 F (36.8 C)     Temp Source 01/19/21 0827 Oral     SpO2 01/19/21 0827 98 %     Weight --      Height --      Head Circumference --      Peak Flow --      Pain Score 01/19/21 0826 5     Pain Loc --      Pain Edu? --      Excl. in GC? --   No data found.  Updated Vital Signs BP 133/86 (BP Location: Left Arm)    Pulse 87    Temp 98 F (36.7 C) (Oral)    Resp 20    SpO2 97%   Physical Exam Vitals and nursing note reviewed.  Constitutional:      General: He is not in acute distress.    Appearance: Normal appearance. He is not ill-appearing.  HENT:     Head: Normocephalic  and atraumatic.     Salivary Glands: Right salivary gland is not diffusely enlarged or tender. Left salivary gland is not diffusely enlarged or tender.     Right Ear: Hearing, ear canal and external ear normal. No drainage. A middle ear effusion (Suppurative) is present. There is no impacted cerumen. Tympanic membrane is injected and bulging. Tympanic membrane is not erythematous.     Left Ear: Hearing, tympanic membrane, ear canal and external ear normal. No drainage.  No middle ear effusion. There is no impacted cerumen. Tympanic membrane is not injected, erythematous or bulging.     Nose: Mucosal edema, congestion and rhinorrhea present. No nasal deformity or septal deviation. Rhinorrhea is clear.     Right Turbinates: Enlarged. Not swollen or pale.     Left Turbinates: Enlarged. Not swollen or pale.     Right Sinus: No maxillary sinus tenderness or frontal sinus tenderness.      Left Sinus: No maxillary sinus tenderness or frontal sinus tenderness.     Mouth/Throat:     Lips: Pink. No lesions.     Mouth: Mucous membranes are moist. No oral lesions.     Pharynx: Uvula midline. Oropharyngeal exudate and posterior oropharyngeal erythema present. No pharyngeal swelling or uvula swelling.     Tonsils: No tonsillar exudate. 0 on the right. 0 on the left.  Eyes:     General: Lids are normal.        Right eye: No discharge.        Left eye: No discharge.     Extraocular Movements: Extraocular movements intact.     Conjunctiva/sclera: Conjunctivae normal.     Right eye: Right conjunctiva is not injected.     Left eye: Left conjunctiva is not injected.  Neck:     Trachea: Trachea and phonation normal.  Cardiovascular:     Rate and Rhythm: Normal rate and regular rhythm.     Pulses: Normal pulses.     Heart sounds: Normal heart sounds. No murmur heard.   No friction rub. No gallop.  Pulmonary:     Effort: Pulmonary effort is normal. No accessory muscle usage, prolonged expiration or respiratory distress.     Breath sounds: Normal breath sounds. No stridor, decreased air movement or transmitted upper airway sounds. No decreased breath sounds, wheezing, rhonchi or rales.  Chest:     Chest wall: No tenderness.  Musculoskeletal:        General: Normal range of motion.     Cervical back: Normal range of motion and neck supple. Normal range of motion.  Lymphadenopathy:     Cervical: No cervical adenopathy.  Skin:    General: Skin is warm and dry.     Findings: No erythema or rash.  Neurological:     General: No focal deficit present.     Mental Status: He is alert and oriented to person, place, and time.  Psychiatric:        Mood and Affect: Mood normal.        Behavior: Behavior normal.    Visual Acuity Right Eye Distance:   Left Eye Distance:   Bilateral Distance:    Right Eye Near:   Left Eye Near:    Bilateral Near:     UC Couse / Diagnostics /  Procedures:    EKG  Radiology DG Chest 2 View  Result Date: 05/17/2021 CLINICAL DATA:  Cough EXAM: CHEST - 2 VIEW COMPARISON:  02/04/2020 FINDINGS: The heart size and mediastinal contours are within  normal limits. Both lungs are clear. The visualized skeletal structures are unremarkable. IMPRESSION: No active cardiopulmonary disease. Electronically Signed   By: Ernie AvenaPalani  Rathinasamy M.D.   On: 05/17/2021 17:48    Procedures Procedures (including critical care time)  UC Diagnoses / Final Clinical Impressions(s)   I have reviewed the triage vital signs and the nursing notes.  Pertinent labs & imaging results that were available during my care of the patient were reviewed by me and considered in my medical decision making (see chart for details).   Final diagnoses:  Pharyngitis, unspecified etiology  Patient was agreeable to going to [facility] to have an x-ray done of his chest.  Patient was provided with a prescription for Augmentin to cover him for acute otitis media as well as possible bacterial infection in his throat.  Augmentin chosen to provide coverage for possible pneumonia as well.  Return precautions advised.  Conservative care recommended.  Viral testing not indicated given acute bacterial infection.  ED Prescriptions     Medication Sig Dispense Auth. Provider   amoxicillin-clavulanate (AUGMENTIN) 875-125 MG tablet Take 1 tablet by mouth 2 (two) times daily for 10 days. 20 tablet Theadora RamaMorgan, Maizy Davanzo Scales, PA-C      PDMP not reviewed this encounter.  Pending results:  Labs Reviewed  CULTURE, GROUP A STREP [facility]  POCT RAPID STREP A (OFFICE)    Medications Ordered in UC: Medications - No data to display  Disposition Upon Discharge:  Condition: stable for discharge home Home: take medications as prescribed; routine discharge instructions as discussed; follow up as advised.  Patient presented with an acute illness with associated systemic symptoms  and significant discomfort requiring urgent management. In my opinion, this is a condition that a prudent lay person (someone who possesses an average knowledge of health and medicine) may potentially expect to result in complications if not addressed urgently such as respiratory distress, impairment of bodily function or dysfunction of bodily organs.   Routine symptom specific, illness specific and/or disease specific instructions were discussed with the patient and/or caregiver at length.   As such, the patient has been evaluated and assessed, work-up was performed and treatment was provided in alignment with urgent care protocols and evidence based medicine.  Patient/parent/caregiver has been advised that the patient may require follow up for further testing and treatment if the symptoms continue in spite of treatment, as clinically indicated and appropriate.  If the patient was tested for COVID-19, Influenza and/or RSV, then the patient/parent/guardian was advised to isolate at home pending the results of his/her diagnostic coronavirus test and potentially longer if theyre positive. I have also advised pt that if his/her COVID-19 test returns positive, it's recommended to self-isolate for at least 10 days after symptoms first appeared AND until fever-free for 24 hours without fever reducer AND other symptoms have improved or resolved. Discussed self-isolation recommendations as well as instructions for household member/close contacts as per the [facility] and [place] DHHS, and also gave patient the COVID packet with this information.  Patient/parent/caregiver has been [facility] 3-5 days if no better; to PCP or the Emergency Department if new signs and symptoms develop, or if the current signs or symptoms continue to change or worsen for further workup, evaluation and treatment as clinically indicated and appropriate  The patient will follow up with their current PCP if and as advised.  If the patient does not currently have a PCP we will assist them in obtaining  one.   The patient may need specialty follow up if the symptoms continue, in spite of conservative treatment and management, for further workup, evaluation, consultation and treatment as clinically indicated and appropriate.  Patient/parent/caregiver verbalized understanding and agreement of plan as discussed.  All questions were addressed during visit.  Please see discharge instructions below for further details of plan.  Discharge Instructions:   Discharge Instructions      Please go to [facility] to have a chest x-ray done.  You will not need to be admitted as a patient at this location to have your x-ray, please let them know that you were seen in urgent care today and that this was ordered for you.  I will give you a call later this evening to let you know what the x-ray showed.  To treat the bacterial infection in your right ear, please begin Augmentin, 1 tablet twice daily for the next 10 days.  Once I have the results of your x-ray, if any further antibiotics need to be prescribed I will be happy to do that at that time.  Your rapid strep test today is negative, throat culture will be performed per our protocol.  Because Augmentin does treat streptococcal pharyngitis, if your throat culture is positive, you will have already begun treatment.  Please see the list below for recommended medications, dosages and frequencies to provide relief of your current symptoms:     Ibuprofen  (Advil, Motrin): This is a good anti-inflammatory medication which addresses aches, pains and inflammation of the upper airways that causes sinus and nasal congestion as well as in the lower airways which makes your cough feel tight and sometimes burn.  I recommend that you take between 400 to 600 mg every 6-8 hours as needed.      Acetaminophen (Tylenol): This is a good fever reducer.  If your body  temperature rises above 101.5 as measured with a thermometer, it is recommended that you take 1,000 mg every 8 hours until your temperature falls below 101.5, please not take more than 3,000 mg of acetaminophen either as a separate medication or as in ingredient in an over-the-counter cold/flu preparation within a 24-hour period.      Guaifenesin (Robitussin, Mucinex): This is an expectorant.  This helps break up chest congestion and loosen up thick nasal drainage making phlegm and drainage more liquid and therefore easier to remove.  I recommend being 400 mg three times daily as needed.      Conservative care is also recommended at this time.  This includes rest, pushing clear fluids and activity as tolerated.  Warm beverages such as teas and broths versus cold beverages/popsicles and frozen sherbet/sorbet are your choice, both warm and cold are beneficial.  You may also notice that your appetite is reduced; this is okay as long as you are drinking plenty of clear fluids.    Please follow-up with either your primary care provider or with urgent care for repeat evaluation you of your lungs in the next 3 to 4 days to ensure that you are improving and also to evaluate whether or not your treatment regimen needs to be adjusted.   Thank you for visiting urgent care today.  We appreciate the opportunity to participate in your care.   Thank you for visiting urgent care today.     This office note has been dictated using Teaching laboratory technician.  Unfortunately, and despite my best efforts, this method of dictation can  sometimes lead to occasional typographical or grammatical errors.  I apologize in advance if this occurs.     Theadora Rama Scales, [place] 05/17/21 [phone]

## 2021-05-17 NOTE — ED Triage Notes (Signed)
Pt c/o sore throat, drainage and a productive cough since Saturday.

## 2021-05-17 NOTE — Discharge Instructions (Addendum)
Please go to [facility] at [facility] to have a chest x-ray done.  You will not need to be admitted as a patient at this location to have your x-ray, please let them know that you were seen in urgent care today and that this was ordered for you.  I will give you a call later this evening to let you know what the x-ray showed.  To treat the bacterial infection in your right ear, please begin Augmentin, 1 tablet twice daily for the next 10 days.  Once I have the results of your x-ray, if any further antibiotics need to be prescribed I will be happy to do that at that time.  Your rapid strep test today is negative, throat culture will be performed per our protocol.  Because Augmentin does treat streptococcal pharyngitis, if your throat culture is positive, you will have already begun treatment.  Please see the list below for recommended medications, dosages and frequencies to provide relief of your current symptoms:     Ibuprofen  (Advil, Motrin): This is a good anti-inflammatory medication which addresses aches, pains and inflammation of the upper airways that causes sinus and nasal congestion as well as in the lower airways which makes your cough feel tight and sometimes burn.  I recommend that you take between 400 to 600 mg every 6-8 hours as needed.      Acetaminophen (Tylenol): This is a good fever reducer.  If your body temperature rises above 101.5 as measured with a thermometer, it is recommended that you take 1,000 mg every 8 hours until your temperature falls below 101.5, please not take more than 3,000 mg of acetaminophen either as a separate medication or as in ingredient in an over-the-counter cold/flu preparation within a 24-hour period.      Guaifenesin (Robitussin, Mucinex): This is an expectorant.  This helps break up chest congestion and loosen up thick nasal drainage making phlegm and drainage more liquid and therefore easier to remove.  I recommend being 400 mg  three times daily as needed.      Conservative care is also recommended at this time.  This includes rest, pushing clear fluids and activity as tolerated.  Warm beverages such as teas and broths versus cold beverages/popsicles and frozen sherbet/sorbet are your choice, both warm and cold are beneficial.  You may also notice that your appetite is reduced; this is okay as long as you are drinking plenty of clear fluids.    Please follow-up with either your primary care provider or with urgent care for repeat evaluation you of your lungs in the next 3 to 4 days to ensure that you are improving and also to evaluate whether or not your treatment regimen needs to be adjusted.   Thank you for visiting urgent care today.  We appreciate the opportunity to participate in your care.   Thank you for visiting urgent care today.

## 2021-05-21 ENCOUNTER — Other Ambulatory Visit: Payer: Self-pay | Admitting: Pharmacist

## 2021-05-21 DIAGNOSIS — Z79899 Other long term (current) drug therapy: Secondary | ICD-10-CM

## 2021-05-21 LAB — CULTURE, GROUP A STREP (THRC)

## 2021-05-23 ENCOUNTER — Other Ambulatory Visit: Payer: Self-pay

## 2021-05-23 ENCOUNTER — Ambulatory Visit (INDEPENDENT_AMBULATORY_CARE_PROVIDER_SITE_OTHER): Payer: BC Managed Care – PPO | Admitting: Pharmacist

## 2021-05-23 ENCOUNTER — Other Ambulatory Visit: Payer: BC Managed Care – PPO

## 2021-05-23 DIAGNOSIS — Z113 Encounter for screening for infections with a predominantly sexual mode of transmission: Secondary | ICD-10-CM | POA: Diagnosis not present

## 2021-05-23 DIAGNOSIS — Z79899 Other long term (current) drug therapy: Secondary | ICD-10-CM | POA: Diagnosis not present

## 2021-05-23 MED ORDER — CABOTEGRAVIR ER 600 MG/3ML IM SUER
600.0000 mg | Freq: Once | INTRAMUSCULAR | Status: AC
  Administered 2021-05-23: 600 mg via INTRAMUSCULAR

## 2021-05-23 NOTE — Progress Notes (Signed)
? ?HPI: Glenn Rivera is a 42 y.o. male who presents to the [place] clinic for Apretude administration and HIV PrEP follow up. ? ?Patient Active Problem List  ? Diagnosis Date Noted  ? Abscess of left lung with pneumonia ([place]) 02/29/2020  ? BOOP (bronchiolitis obliterans with organizing pneumonia) ([place]) 02/29/2020  ? Examination of participant or control in clinical research 02/12/2016  ? ? ?Patient's Medications  ?New Prescriptions  ? No medications on file  ?Previous Medications  ? ALBUTEROL (VENTOLIN HFA) 108 (90 BASE) MCG/ACT INHALER    Inhale into the lungs.  ? AMOXICILLIN-CLAVULANATE (AUGMENTIN) 875-125 MG TABLET    Take 1 tablet by mouth 2 (two) times daily for 10 days.  ? CABOTEGRAVIR ER (APRETUDE) 600 MG/3ML INJECTION    Inject 3 mLs (600 mg total) into the muscle every 2 (two) months.  ?Modified Medications  ? No medications on file  ?Discontinued Medications  ? No medications on file  ? ? ?Allergies: ?Allergies  ?Allergen Reactions  ? Lactose Intolerance (Gi) Diarrhea and Other (See Comments)  ?  Diarrhea, upset stomach, and migraine  ? ? ?Past Medical History: ?Past Medical History:  ?Diagnosis Date  ? Depression 03/25/2010  ? History  ? Eczema 2010  ? intermittent / seasonal on soles of feet bilaterally  ? Examination of participant or control in clinical research 02/12/2016  ? ? ?Social History: ?Social History  ? ?Socioeconomic History  ? Marital status: Single  ?  Spouse name: Not on file  ? Number of children: Not on file  ? Years of education: Not on file  ? Highest education level: Not on file  ?Occupational History  ? Not on file  ?Tobacco Use  ? Smoking status: Never  ? Smokeless tobacco: Never  ?Substance and Sexual Activity  ? Alcohol use: Yes  ?  Alcohol/week: 2.0 standard drinks  ?  Types: 2 Shots of liquor per week  ?  Comment: 1x/month  ? Drug use: No  ? Sexual activity: Yes  ?  Comment: Pansexual  ?Other Topics Concern  ? Not on file  ?Social History Narrative  ? Not on file   ? ?Social Determinants of Health  ? ?Financial Resource Strain: Not on file  ?Food Insecurity: Not on file  ?Transportation Needs: Not on file  ?Physical Activity: Not on file  ?Stress: Not on file  ?Social Connections: Not on file  ? ? ?Labs: ?Lab Results  ?Component Value Date  ? HIV1RNAQUANT Not Detected 03/28/2021  ? HIV1RNAQUANT Not Detected 01/31/2021  ? HIV1RNAQUANT Not Detected 09/26/2020  ? ? ?RPR and STI ?Lab Results  ?Component Value Date  ? LABRPR NON-REACTIVE 03/28/2021  ? LABRPR NON-REACTIVE 09/26/2020  ? LABRPR NON-REACTIVE 04/12/2020  ? LABRPR NON-REACTIVE 07/09/2019  ? LABRPR NON-REACTIVE 01/20/2019  ? ? ?STI Results GC GC CT CT  ?03/28/2021 Negative - Negative -  ?03/28/2021 Negative - Negative -  ?01/31/2021 Negative - Negative -  ?11/29/2020 Negative - Negative -  ?11/29/2020 Negative - Negative -  ?11/29/2020 Negative - Negative -  ?03/27/2018 **POSITIVE**(A) - Negative -  ?10/02/2016 - NOT DETECTED - NOT DETECTED  ?02/12/2016 - NOT DETECTED - NOT DETECTED  ? ? ?Hepatitis B ?Lab Results  ?Component Value Date  ? HEPBSAB NEG 02/12/2016  ? HEPBSAG NEGATIVE 02/01/2016  ? HEPBCAB NON REACTIVE 02/12/2016  ? ?Hepatitis C ?Lab Results  ?Component Value Date  ? HEPCAB NON-REACTIVE 09/26/2020  ? ?Hepatitis A ?No results found for: HAV ?Lipids: ?Lab Results  ?Component  Value Date  ? CHOL 133 02/09/2018  ? TRIG 46 02/09/2018  ? HDL 60 02/09/2018  ? CHOLHDL 2.2 02/09/2018  ? VLDL 8 02/12/2016  ? [place] 60 02/09/2018  ? ? ?TARGET DATE: The 7th of the month ? ?Current PrEP Regimen: ?Apretude ? ?Assessment: ?Glenn Rivera presents today for their maintenance injection of Apretude and to follow up for HIV PrEP. He has been with one new sexual partner consistently since his last visit and has participated in oral sex. Will check urine/oral cytologies and HIV RNA today. 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 ? ?Administered cabotegravir  600mg /74mL in right upper outer quadrant of the gluteal muscle. Will make follow up appointments for maintenance injections every 2 months.  ? ?Plan: ?- Maintenance injections scheduled for 5/3 and 7/5 with me  ?- Check HIV RNA and urine/oral cytologies  ?- Call with any issues or questions ? ?Alfonse Spruce, PharmD, CPP ?Clinical Pharmacist Practitioner ?Infectious Diseases Clinical Pharmacist ?[place] [facility]  ?

## 2021-05-24 LAB — GC/CHLAMYDIA PROBE, AMP (THROAT)
Chlamydia trachomatis RNA: NOT DETECTED
Neisseria gonorrhoeae RNA: NOT DETECTED

## 2021-05-26 LAB — C. TRACHOMATIS/N. GONORRHOEAE RNA
C. trachomatis RNA, TMA: NOT DETECTED
N. gonorrhoeae RNA, TMA: NOT DETECTED

## 2021-05-26 LAB — HIV-1 RNA QUANT-NO REFLEX-BLD
HIV 1 RNA Quant: NOT DETECTED Copies/mL
HIV-1 RNA Quant, Log: NOT DETECTED Log cps/mL

## 2021-06-14 ENCOUNTER — Other Ambulatory Visit: Payer: Self-pay

## 2021-06-14 ENCOUNTER — Ambulatory Visit
Admission: EM | Admit: 2021-06-14 | Discharge: 2021-06-14 | Disposition: A | Discharge status: Home / Self Care | Payer: BC Managed Care – PPO | Attending: Emergency Medicine | Admitting: Emergency Medicine

## 2021-06-14 DIAGNOSIS — B9689 Other specified bacterial agents as the cause of diseases classified elsewhere: Secondary | ICD-10-CM | POA: Diagnosis present

## 2021-06-14 DIAGNOSIS — Z20818 Contact with and (suspected) exposure to other bacterial communicable diseases: Secondary | ICD-10-CM | POA: Insufficient documentation

## 2021-06-14 DIAGNOSIS — J038 Acute tonsillitis due to other specified organisms: Secondary | ICD-10-CM | POA: Insufficient documentation

## 2021-06-14 LAB — POCT RAPID STREP A (OFFICE): Rapid Strep A Screen: NEGATIVE

## 2021-06-14 MED ORDER — PENICILLIN G BENZATHINE 1200000 UNIT/2ML IM SUSY
1.2000 10*6.[IU] | PREFILLED_SYRINGE | Freq: Once | INTRAMUSCULAR | Status: AC
  Administered 2021-06-14: 1.2 10*6.[IU] via INTRAMUSCULAR

## 2021-06-14 MED ORDER — IBUPROFEN 400 MG PO TABS
400.0000 mg | ORAL_TABLET | Freq: Once | ORAL | Status: AC
  Administered 2021-06-14: 400 mg via ORAL

## 2021-06-14 NOTE — ED Provider Notes (Signed)
?[facility] ? ? ? ?CSN: [id number] ?Arrival date & time: 06/14/21  0830 ?  ? ?HISTORY  ? ?Chief Complaint  ?Patient presents with  ? Sore Throat  ? ?HPI ?Glenn Rivera is a 41 y.o. male. Patient returns with a chief complaint of sore throat and white patches on his tonsils that began 3 days ago, patient states his throat also feels very inflamed but denies difficulty swallowing or managing his secretions.  Patient was diagnosed with streptococcal pharyngitis 1 month ago, states he completed the full course of antibiotics as prescribed but did not change toothbrushes.  Patient denies known sick contacts.  Patient states symptoms today are similar to the symptoms he had at his last episode.  Rapid strep test today was negative as it was when he was seen last month, throat culture last month was positive for streptococcal pharyngitis. ? ?The history is provided by the patient.  ?Past Medical History:  ?Diagnosis Date  ? Depression 03/25/2010  ? History  ? Eczema 2010  ? intermittent / seasonal on soles of feet bilaterally  ? Examination of participant or control in clinical research 02/12/2016  ? ?Patient Active Problem List  ? Diagnosis Date Noted  ? Abscess of left lung with pneumonia ([place]) 02/29/2020  ? BOOP (bronchiolitis obliterans with organizing pneumonia) ([place]) 02/29/2020  ? Examination of participant or control in clinical research 02/12/2016  ? ?History reviewed. No pertinent surgical history. ? ?Home Medications   ? ?Prior to Admission medications   ?Medication Sig Start Date End Date Taking? Authorizing Provider  ?albuterol (VENTOLIN HFA) 108 (90 Base) MCG/ACT inhaler Inhale into the lungs. 01/07/20   [provider]  ?cabotegravir ER (APRETUDE) 600 MG/3ML injection Inject 3 mLs (600 mg total) into the muscle every 2 (two) months. 11/29/20   Esmond Plants, RPH-CPP  ? ?Family History ?History reviewed. No pertinent family history. ?Social History ?Social History  ? ?Tobacco Use  ?  Smoking status: Never  ? Smokeless tobacco: Never  ?Substance Use Topics  ? Alcohol use: Yes  ?  Alcohol/week: 2.0 standard drinks  ?  Types: 2 Shots of liquor per week  ?  Comment: 1x/month  ? Drug use: No  ? ?Allergies   ?Lactose intolerance (gi) ? ?Review of Systems ?Review of Systems ?Pertinent findings noted in history of present illness.  ? ?Physical Exam ?Triage Vital Signs ?ED Triage Vitals  ?Enc Vitals Group  ?   BP 01/19/21 0827 (!) 147/82  ?   Pulse Rate 01/19/21 0827 72  ?   Resp 01/19/21 0827 18  ?   Temp 01/19/21 0827 98.3 ?F (36.8 ?C)  ?   Temp Source 01/19/21 0827 Oral  ?   SpO2 01/19/21 0827 98 %  ?   Weight --   ?   Height --   ?   Head Circumference --   ?   Peak Flow --   ?   Pain Score 01/19/21 0826 5  ?   Pain Loc --   ?   Pain Edu? --   ?   Excl. in [place]? --   ?No data found. ? ?Updated Vital Signs ?BP (!) 146/89 (BP Location: Left Arm)   Pulse (!) 106   Temp 99 ?F (37.2 ?C) (Oral)   Resp 20   SpO2 96%  ? ?Physical Exam ?Constitutional:   ?   General: He is not in acute distress. ?   Appearance: He is well-developed. He is ill-appearing. He is  not toxic-appearing.  ?HENT:  ?   Head: Normocephalic and atraumatic.  ?   Salivary Glands: Right salivary gland is diffusely enlarged and tender. Left salivary gland is diffusely enlarged and tender.  ?   Right Ear: Hearing and external ear normal.  ?   Left Ear: Hearing and external ear normal.  ?   Ears:  ?   Comments: Bilateral EACs with mild erythema, bilateral TMs are normal ?   Nose: No mucosal edema, congestion or rhinorrhea.  ?   Right Turbinates: Not enlarged, swollen or pale.  ?   Left Turbinates: Not enlarged or swollen.  ?   Right Sinus: No maxillary sinus tenderness or frontal sinus tenderness.  ?   Left Sinus: No maxillary sinus tenderness or frontal sinus tenderness.  ?   Mouth/Throat:  ?   Lips: Pink. No lesions.  ?   Mouth: Mucous membranes are moist. No oral lesions or angioedema.  ?   Dentition: No gingival swelling.  ?   Tongue: No  lesions.  ?   Palate: No mass.  ?   Pharynx: Uvula midline. Pharyngeal swelling, oropharyngeal exudate and posterior oropharyngeal erythema present. No uvula swelling.  ?   Tonsils: Tonsillar exudate present. 2+ on the right. 2+ on the left.  ?Eyes:  ?   Extraocular Movements: Extraocular movements intact.  ?   Conjunctiva/sclera: Conjunctivae normal.  ?   Pupils: Pupils are equal, round, and reactive to light.  ?Neck:  ?   Thyroid: No thyroid mass, thyromegaly or thyroid tenderness.  ?   Trachea: Tracheal tenderness present. No abnormal tracheal secretions or tracheal deviation.  ?   Comments: Voice is muffled ?Cardiovascular:  ?   Rate and Rhythm: Normal rate and regular rhythm.  ?   Pulses: Normal pulses.  ?   Heart sounds: Normal heart sounds, S1 normal and S2 normal. No murmur heard. ?  No friction rub. No gallop.  ?Pulmonary:  ?   Effort: Pulmonary effort is normal. No accessory muscle usage, prolonged expiration, respiratory distress or retractions.  ?   Breath sounds: No stridor, decreased air movement or transmitted upper airway sounds. No decreased breath sounds, wheezing, rhonchi or rales.  ?Abdominal:  ?   General: Bowel sounds are normal.  ?   Palpations: Abdomen is soft.  ?   Tenderness: There is generalized abdominal tenderness. There is no right CVA tenderness, left CVA tenderness or rebound. Negative signs include Murphy's sign.  ?   Hernia: No hernia is present.  ?Musculoskeletal:     ?   General: No tenderness. Normal range of motion.  ?   Cervical back: Full passive range of motion without pain, normal range of motion and neck supple.  ?   Right lower leg: No edema.  ?   Left lower leg: No edema.  ?Lymphadenopathy:  ?   Cervical: Cervical adenopathy present.  ?   Right cervical: Superficial cervical adenopathy present.  ?   Left cervical: Superficial cervical adenopathy present.  ?Skin: ?   General: Skin is warm and dry.  ?   Findings: No erythema, lesion or rash.  ?Neurological:  ?   General: No  focal deficit present.  ?   Mental Status: He is alert and oriented to person, place, and time. Mental status is at baseline.  ?Psychiatric:     ?   Mood and Affect: Mood normal.     ?   Behavior: Behavior normal.     ?   Thought  Content: Thought content normal.     ?   Judgment: Judgment normal.  ? ? ?Visual Acuity ?Right Eye Distance:   ?Left Eye Distance:   ?Bilateral Distance:   ? ?Right Eye Near:   ?Left Eye Near:    ?Bilateral Near:    ? ?UC Couse / Diagnostics / Procedures:  ?  ?EKG ? ?Radiology ?No results found. ? ?Procedures ?Procedures (including critical care time) ? ?UC Diagnoses / Final Clinical Impressions(s)   ?I have reviewed the triage vital signs and the nursing notes. ? ?Pertinent labs & imaging results that were available during my care of the patient were reviewed by me and considered in my medical decision making (see chart for details).   ?Final diagnoses:  ?Exposure to group A Streptococcus  ?Acute bacterial tonsillitis  ? 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  Patient advised to buy new toothbrush today.  Patient provided with ibuprofen for pain and advised that he continued 400 mg 3 times daily as needed.  Return precautions advised. ? ?ED Prescriptions   ?None ?  ? ?PDMP not reviewed this encounter. ? ?Pending results:  ?Labs Reviewed  ?CULTURE, GROUP A STREP [facility])  ?POCT RAPID STREP A (OFFICE)  ? ? ?Medications Ordered in UC: ?Medications  ?penicillin g benzathine (BICILLIN LA) 1200000 UNIT/2ML injection 1.2 Million Units (1.2 Million Units Intramuscular Given 06/14/21 0943)  ?ibuprofen (ADVIL) tablet 400 mg (400 mg Oral Given 06/14/21 0943)  ? ? ?Disposition Upon Discharge:  ?Condition: stable for discharge home ?Home: take medications as prescribed; routine discharge instructions as discussed; follow up as advised. ? ?Patient presented with an acute illness with associated  systemic symptoms and significant discomfort requiring urgent management. In my opinion, this is a condition that a prudent lay person (someone who possesses an average knowledge of health and medicine) ma

## 2021-06-14 NOTE — ED Triage Notes (Signed)
Pt c/o sore throat, white patches and inflammation to throat that began this week. ?

## 2021-06-14 NOTE — Discharge Instructions (Addendum)
Your rapid strep test today is negative.  Streptococcal throat culture will be performed per our protocol because of the rapid strep test that we perform here at urgent care only catches 40% of known cases of group A streptococcal pharyngitis and tonsillitis.  Based on my physical exam today and the history that you provided, I believe that you have bacterial pharyngitis.    ? ?Because you have significant swelling of both tonsils, significant redness of both tonsils and white patches on both tonsils and pain with swallowing, you were provided with an injection of Bicillin LA.  No further antibiotic therapy is needed or recommended.   ? ?In 24 hours, you will no longer be considered contagious.   Please discard your toothbrush and any other oral devices that you are currently using and replace them with new ones today.    ? ?For pain relief, please consider the following:  ? ?Ibuprofen  (Advil, Motrin): This is a good anti-inflammatory medication which addresses aches, pains and inflammation of the upper airways that causes sinus and nasal congestion as well as in the lower airways which makes your cough feel tight and sometimes burn.  I recommend that you take between 400 every 6-8 hours as needed.    ? ?Warm salt water gargles: Add 1 teaspoon to 4 ounces of warm but not hot water and gargle the solution mouthful by mouth full until complete.  Repeat this 4 times daily.  Salt is bacteriostatic meaning that it stops bacteria from reproducing. ?  ?Please follow-up with your primary care provider in the next week to ten days for repeat evaluation if you have not had complete resolution of your symptoms.    ?  ?Thank you for visiting urgent care today.  We appreciate the opportunity to participate in your care.  I appreciate you helping me take one step closer to being a better provider. ? ?

## 2021-06-16 LAB — CULTURE, GROUP A STREP (THRC)

## 2021-07-25 ENCOUNTER — Other Ambulatory Visit: Payer: Self-pay

## 2021-07-25 ENCOUNTER — Ambulatory Visit (INDEPENDENT_AMBULATORY_CARE_PROVIDER_SITE_OTHER): Payer: BC Managed Care – PPO | Admitting: Pharmacist

## 2021-07-25 ENCOUNTER — Other Ambulatory Visit (HOSPITAL_COMMUNITY)
Admission: RE | Admit: 2021-07-25 | Discharge: 2021-07-25 | Disposition: A | Discharge status: Home / Self Care | Payer: BC Managed Care – PPO | Source: Ambulatory Visit | Attending: Internal Medicine | Admitting: Internal Medicine

## 2021-07-25 ENCOUNTER — Other Ambulatory Visit: Payer: BC Managed Care – PPO

## 2021-07-25 DIAGNOSIS — Z79899 Other long term (current) drug therapy: Secondary | ICD-10-CM | POA: Insufficient documentation

## 2021-07-25 MED ORDER — CABOTEGRAVIR ER 600 MG/3ML IM SUER
600.0000 mg | Freq: Once | INTRAMUSCULAR | Status: AC
  Administered 2021-07-25: 600 mg via INTRAMUSCULAR

## 2021-07-25 NOTE — Progress Notes (Signed)
? ?HPI: CAGE Glenn Rivera is a 41 y.o. male who presents to the [facility] pharmacy clinic for Apretude administration and HIV PrEP follow up. ? ?Patient Active Problem List  ? Diagnosis Date Noted  ? Abscess of left lung with pneumonia ([facility]) 02/29/2020  ? BOOP (bronchiolitis obliterans with organizing pneumonia) ([facility]) 02/29/2020  ? Examination of participant or control in clinical research 02/12/2016  ? ? ?Patient's Medications  ?New Prescriptions  ? No medications on file  ?Previous Medications  ? ALBUTEROL (VENTOLIN HFA) 108 (90 BASE) MCG/ACT INHALER    Inhale into the lungs.  ? CABOTEGRAVIR ER (APRETUDE) 600 MG/3ML INJECTION    Inject 3 mLs (600 mg total) into the muscle every 2 (two) months.  ?Modified Medications  ? No medications on file  ?Discontinued Medications  ? No medications on file  ? ? ?Allergies: ?Allergies  ?Allergen Reactions  ? Lactose Intolerance (Gi) Diarrhea and Other (See Comments)  ?  Diarrhea, upset stomach, and migraine  ? ? ?Past Medical History: ?Past Medical History:  ?Diagnosis Date  ? Depression 03/25/2010  ? History  ? Eczema 2010  ? intermittent / seasonal on soles of feet bilaterally  ? Examination of participant or control in clinical research 02/12/2016  ? ? ?Social History: ?Social History  ? ?Socioeconomic History  ? Marital status: Single  ?  Spouse name: Not on file  ? Number of children: Not on file  ? Years of education: Not on file  ? Highest education level: Not on file  ?Occupational History  ? Not on file  ?Tobacco Use  ? Smoking status: Never  ? Smokeless tobacco: Never  ?Substance and Sexual Activity  ? Alcohol use: Yes  ?  Alcohol/week: 2.0 standard drinks  ?  Types: 2 Shots of liquor per week  ?  Comment: 1x/month  ? Drug use: No  ? Sexual activity: Yes  ?  Comment: Pansexual  ?Other Topics Concern  ? Not on file  ?Social History Narrative  ? Not on file  ? ?Social Determinants of Health  ? ?Financial Resource Strain: Not on file  ?Food Insecurity: Not on file   ?Transportation Needs: Not on file  ?Physical Activity: Not on file  ?Stress: Not on file  ?Social Connections: Not on file  ? ? ?Labs: ?Lab Results  ?Component Value Date  ? HIV1RNAQUANT Not Detected 05/23/2021  ? HIV1RNAQUANT Not Detected 03/28/2021  ? HIV1RNAQUANT Not Detected 01/31/2021  ? ? ?RPR and STI ?Lab Results  ?Component Value Date  ? LABRPR NON-REACTIVE 03/28/2021  ? LABRPR NON-REACTIVE 09/26/2020  ? LABRPR NON-REACTIVE 04/12/2020  ? LABRPR NON-REACTIVE 07/09/2019  ? LABRPR NON-REACTIVE 01/20/2019  ? ? ?STI Results GC GC CT CT  ?03/28/2021 ?10:16 AM Negative    Negative     ?03/28/2021 ? 9:56 AM Negative    Negative     ?01/31/2021 ? 9:38 AM Negative    Negative     ?11/29/2020 ?10:34 AM Negative    Negative     ?11/29/2020 ?10:08 AM Negative    ? Negative    Negative    ? Negative     ?03/27/2018 ?12:00 AM **POSITIVE**    Negative     ?10/02/2016 ? 8:10 AM  NOT DETECTED    NOT DETECTED    ?02/12/2016 ? 9:00 AM  NOT DETECTED    NOT DETECTED    ? ? ?Hepatitis B ?Lab Results  ?Component Value Date  ? HEPBSAB NEG 02/12/2016  ? HEPBSAG NEGATIVE 02/01/2016  ?  HEPBCAB NON REACTIVE 02/12/2016  ? ?Hepatitis C ?Lab Results  ?Component Value Date  ? HEPCAB NON-REACTIVE 09/26/2020  ? ?Hepatitis A ?No results found for: HAV ?Lipids: ?Lab Results  ?Component Value Date  ? CHOL 133 02/09/2018  ? TRIG 46 02/09/2018  ? HDL 60 02/09/2018  ? CHOLHDL 2.2 02/09/2018  ? VLDL 8 02/12/2016  ? LDLCALC 60 02/09/2018  ? ? ?TARGET DATE: The 7th of the month ? ?Current PrEP Regimen: ?Apretude ? ?Assessment: ?[name] presents today for their maintenance injection of Apretude and to follow up for HIV PrEP. Rapid HIV blood test was drawn immediately prior to injection and was negative. Patient also had HIV RNA drawn today. Administered cabotegravir 600mg /97mL in right upper outer quadrant of the gluteal muscle. Will make follow up appointments for maintenance injections every 2 months.  ? ?Patient is currently in a new relationship and requested  urine/oral/rectal GC/CT cytologies.  ? ? ? ?Plan: ?- Maintenance injections scheduled for 7/5 with me ?- Check HIV RNA and urine/oral/rectal GC/CT cytologies ?- Call with any issues or questions ? ?9/5, PharmD, CPP ?Clinical Pharmacist Practitioner ?Infectious Diseases Clinical Pharmacist ?[facility]  ?

## 2021-07-26 LAB — CYTOLOGY, (ORAL, ANAL, URETHRAL) ANCILLARY ONLY
Chlamydia: NEGATIVE
Chlamydia: NEGATIVE
Comment: NEGATIVE
Comment: NEGATIVE
Comment: NORMAL
Comment: NORMAL
Neisseria Gonorrhea: NEGATIVE
Neisseria Gonorrhea: NEGATIVE

## 2021-07-26 LAB — URINE CYTOLOGY ANCILLARY ONLY
Chlamydia: NEGATIVE
Comment: NEGATIVE
Comment: NORMAL
Neisseria Gonorrhea: NEGATIVE

## 2021-07-28 LAB — HIV-1 RNA QUANT-NO REFLEX-BLD
HIV 1 RNA Quant: NOT DETECTED copies/mL
HIV-1 RNA Quant, Log: NOT DETECTED Log copies/mL

## 2021-08-18 ENCOUNTER — Ambulatory Visit
Admission: EM | Admit: 2021-08-18 | Discharge: 2021-08-18 | Disposition: A | Discharge status: Home / Self Care | Payer: BC Managed Care – PPO | Attending: Urgent Care | Admitting: Urgent Care

## 2021-08-18 ENCOUNTER — Encounter: Payer: Self-pay | Admitting: Urgent Care

## 2021-08-18 DIAGNOSIS — R21 Rash and other nonspecific skin eruption: Secondary | ICD-10-CM

## 2021-08-18 MED ORDER — GRISEOFULVIN MICROSIZE 500 MG PO TABS
500.0000 mg | ORAL_TABLET | Freq: Two times a day (BID) | ORAL | 0 refills | Status: AC
Start: 2021-08-18 — End: 2021-09-01

## 2021-08-18 NOTE — ED Triage Notes (Signed)
Triaged by provider  

## 2021-08-18 NOTE — ED Provider Notes (Signed)
[facility]   MRN: [id number] DOB: Feb 26, 1981  Subjective:   Glenn Rivera is a 41 y.o. male presenting for several year history of persistent intermittent rash over left dorsal aspect of his foot.  Patient states that he gets flareups with fluid-filled vesicles that respond to the use of peroxide.  When the rash breaks out it is very itchy.  Currently he does not have an active blisterlike rash the way he normally gets but still would like treatment.  He has had multiple visits with different providers including a dermatologist at [facility].  They have diagnosed him with tinea pedis and used different medications including ketoconazole cream and steroid creams.  His rash has not responded to either.  Patient would like to get treated for tinea incognito with a specific antifungal, griseofulvin.  No current facility-administered medications for this encounter.  Current Outpatient Medications:    cabotegravir ER (APRETUDE) 600 MG/3ML injection, Inject 3 mLs (600 mg total) into the muscle every 2 (two) months., Disp: 3 mL, Rfl: 0   Allergies  Allergen Reactions   Lactose Intolerance (Gi) Diarrhea and Other (See Comments)    Diarrhea, upset stomach, and migraine    Past Medical History:  Diagnosis Date   Depression 03/25/2010   History   Eczema 2010   intermittent / seasonal on soles of feet bilaterally   Examination of participant or control in clinical research 02/12/2016     History reviewed. No pertinent surgical history.  History reviewed. No pertinent family history.  Social History   Tobacco Use   Smoking status: Never   Smokeless tobacco: Never  Substance Use Topics   Alcohol use: Yes    Alcohol/week: 2.0 standard drinks    Types: 2 Shots of liquor per week    Comment: 1x/month   Drug use: No    ROS   Objective:   Vitals: BP 136/87 (BP Location: Right Arm)   Pulse 79   Temp 98.5 F (36.9 C) (Oral)   Resp 16   SpO2 98%    Physical Exam Constitutional:      General: He is not in acute distress.    Appearance: Normal appearance. He is well-developed and normal weight. He is not ill-appearing, toxic-appearing or diaphoretic.  HENT:     Head: Normocephalic and atraumatic.     Right Ear: External ear normal.     Left Ear: External ear normal.     Nose: Nose normal.     Mouth/Throat:     Pharynx: Oropharynx is clear.  Eyes:     General: No scleral icterus.       Right eye: No discharge.        Left eye: No discharge.     Extraocular Movements: Extraocular movements intact.  Cardiovascular:     Rate and Rhythm: Normal rate.  Pulmonary:     Effort: Pulmonary effort is normal.  Musculoskeletal:     Cervical back: Normal range of motion.       Feet:  Neurological:     Mental Status: He is alert and oriented to person, place, and time.  Psychiatric:        Mood and Affect: Mood normal.        Behavior: Behavior normal.        Thought Content: Thought content normal.        Judgment: Judgment normal.         Assessment and Plan :   PDMP not reviewed  this encounter.  1. Rash and nonspecific skin eruption    Discussed multiple possible etiologies for the patient's chronic rash.  I was agreeable to trialing a round of griseofulvin.  I did emphasize need for dermatology consult, consideration for biopsy as he has failed multiple treatments and has had the rash for years now.  Patient is agreeable to attempting a consultation with dermatology practice.  He will contact a few of the clinics that I provided him information for.  Otherwise, if he is not able to get in with them soon and would like more help with his rash I advised that he can return to see me and we will trial a different medication.  Counseled patient on potential for adverse effects with medications prescribed/recommended today, ER and return-to-clinic precautions discussed, patient verbalized understanding.    Wallis Bamberg,  [place] 08/18/21 [phone]

## 2021-09-24 ENCOUNTER — Other Ambulatory Visit: Payer: Self-pay | Admitting: Pharmacist

## 2021-09-24 DIAGNOSIS — Z79899 Other long term (current) drug therapy: Secondary | ICD-10-CM

## 2021-09-24 DIAGNOSIS — Z113 Encounter for screening for infections with a predominantly sexual mode of transmission: Secondary | ICD-10-CM

## 2021-09-26 ENCOUNTER — Other Ambulatory Visit (HOSPITAL_COMMUNITY)
Admission: RE | Admit: 2021-09-26 | Discharge: 2021-09-26 | Disposition: A | Discharge status: Home / Self Care | Payer: BC Managed Care – PPO | Source: Ambulatory Visit | Attending: Internal Medicine | Admitting: Internal Medicine

## 2021-09-26 ENCOUNTER — Other Ambulatory Visit: Payer: BC Managed Care – PPO

## 2021-09-26 ENCOUNTER — Ambulatory Visit (INDEPENDENT_AMBULATORY_CARE_PROVIDER_SITE_OTHER): Payer: BC Managed Care – PPO | Admitting: Pharmacist

## 2021-09-26 ENCOUNTER — Other Ambulatory Visit: Payer: Self-pay

## 2021-09-26 DIAGNOSIS — Z113 Encounter for screening for infections with a predominantly sexual mode of transmission: Secondary | ICD-10-CM | POA: Diagnosis present

## 2021-09-26 DIAGNOSIS — Z79899 Other long term (current) drug therapy: Secondary | ICD-10-CM

## 2021-09-26 DIAGNOSIS — Z7253 High risk bisexual behavior: Secondary | ICD-10-CM

## 2021-09-26 MED ORDER — CABOTEGRAVIR ER 600 MG/3ML IM SUER
600.0000 mg | Freq: Once | INTRAMUSCULAR | Status: AC
  Administered 2021-09-26: 600 mg via INTRAMUSCULAR

## 2021-09-26 NOTE — Progress Notes (Signed)
HPI: Glenn Rivera is a 41 y.o. male who presents to the [facility] pharmacy clinic for Apretude administration and HIV PrEP follow up.  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  Modified Medications   No medications on file  Discontinued Medications   No medications on file    Allergies: Allergies  Allergen Reactions   Lactose Intolerance (Gi) Diarrhea and Other (See Comments)    Diarrhea, upset stomach, and migraine    Past Medical History: Past Medical History:  Diagnosis Date   Depression 03/25/2010   History   Eczema 2010   intermittent / seasonal on soles of feet bilaterally   Examination of participant or control in clinical research 02/12/2016    Social History: Social History   Socioeconomic History   Marital status: Single    Spouse name: Not on file   Number of children: Not on file   Years of education: Not on file   Highest education level: Not on file  Occupational History   Not on file  Tobacco Use   Smoking status: Never   Smokeless tobacco: Never  Substance and Sexual Activity   Alcohol use: Yes    Alcohol/week: 2.0 standard drinks of alcohol    Types: 2 Shots of liquor per week    Comment: 1x/month   Drug use: No   Sexual activity: Yes    Comment: Pansexual  Other Topics Concern   Not on file  Social History Narrative   Not on file   Social Determinants of Health   Financial Resource Strain: Not on file  Food Insecurity: Not on file  Transportation Needs: Not on file  Physical Activity: Not on file  Stress: Not  on file  Social Connections: Not on file    Labs: Lab Results  Component Value Date   HIV1RNAQUANT NOT DETECTED 07/25/2021   HIV1RNAQUANT Not Detected 05/23/2021   HIV1RNAQUANT Not Detected 03/28/2021    RPR and STI Lab Results  Component Value Date   LABRPR NON-REACTIVE 03/28/2021   LABRPR NON-REACTIVE 09/26/2020   LABRPR NON-REACTIVE 04/12/2020   LABRPR NON-REACTIVE 07/09/2019   LABRPR NON-REACTIVE 01/20/2019    STI Results GC GC CT CT  07/25/2021 10:43 AM Negative    Negative   Negative    Negative    07/25/2021  9:26 AM Negative   Negative    03/28/2021 10:16 AM Negative   Negative    03/28/2021  9:56 AM Negative   Negative    01/31/2021  9:38 AM Negative   Negative    11/29/2020 10:34 AM Negative   Negative    11/29/2020 10:08 AM Negative    Negative   Negative    Negative    03/27/2018 12:00 AM **POSITIVE**   Negative    10/02/2016  8:10 AM  NOT DETECTED   NOT DETECTED   02/12/2016  9:00 AM  NOT DETECTED   NOT DETECTED     Hepatitis B Lab Results  Component Value Date   HEPBSAB NEG 02/12/2016   HEPBSAG NEGATIVE 02/01/2016   HEPBCAB NON REACTIVE  02/12/2016   Hepatitis C Lab Results  Component Value Date   HEPCAB NON-REACTIVE 09/26/2020   Hepatitis A No results found for: "HAV" Lipids: Lab Results  Component Value Date   CHOL 133 02/09/2018   TRIG 46 02/09/2018   HDL 60 02/09/2018   CHOLHDL 2.2 02/09/2018   VLDL 8 02/12/2016   LDLCALC 60 02/09/2018    TARGET DATE: The 7th of the month  Current PrEP Regimen: Apretude  Assessment: Marlyn presents today for their Apretude injection and to follow up for HIV PrEP. No issues with past injections. No known exposures to any STIs and no signs or symptoms of any STIs today. Last STI screening was 5/3 and was negative. Screened patient for acute HIV symptoms such as fatigue, muscle aches, rash, sore throat, lymphadenopathy, headache, night sweats, nausea/vomiting/diarrhea, and fever. Patient denies any  symptoms. No new partners since last injection. Rapid HIV blood test was drawn immediately prior to injection and was negative. Patient also had HIV RNA drawn today. Patient no longer with current partner; deferred any further STI testing today. Will check HAV sAb today   Administered cabotegravir 600mg /64mL in left upper outer quadrant of the gluteal muscle. Will make follow up appointments for maintenance injections every 2 months.   Plan: - Maintenance injections scheduled for 9/5, 10/31, and 1/5 with me  - Call with any issues or questions  11/31, PharmD, CPP Clinical Pharmacist Practitioner Infectious Diseases Clinical Pharmacist [facility]

## 2021-09-27 LAB — URINE CYTOLOGY ANCILLARY ONLY
Chlamydia: NEGATIVE
Comment: NEGATIVE
Comment: NORMAL
Neisseria Gonorrhea: NEGATIVE

## 2021-09-29 LAB — HIV-1 RNA QUANT-NO REFLEX-BLD
HIV 1 RNA Quant: NOT DETECTED Copies/mL
HIV-1 RNA Quant, Log: NOT DETECTED Log cps/mL

## 2021-09-29 LAB — RPR: RPR Ser Ql: NONREACTIVE

## 2021-09-29 LAB — HEPATITIS A ANTIBODY, TOTAL: Hepatitis A AB,Total: NONREACTIVE

## 2021-11-02 ENCOUNTER — Other Ambulatory Visit (HOSPITAL_COMMUNITY): Payer: Self-pay

## 2021-11-21 ENCOUNTER — Encounter: Payer: Self-pay | Admitting: Pharmacist

## 2021-11-21 ENCOUNTER — Other Ambulatory Visit: Payer: Self-pay | Admitting: Pharmacist

## 2021-11-21 DIAGNOSIS — Z79899 Other long term (current) drug therapy: Secondary | ICD-10-CM

## 2021-11-21 DIAGNOSIS — Z113 Encounter for screening for infections with a predominantly sexual mode of transmission: Secondary | ICD-10-CM

## 2021-11-27 ENCOUNTER — Ambulatory Visit: Payer: BC Managed Care – PPO | Admitting: Pharmacist

## 2021-11-27 ENCOUNTER — Other Ambulatory Visit: Payer: BC Managed Care – PPO

## 2021-12-04 ENCOUNTER — Other Ambulatory Visit (HOSPITAL_COMMUNITY)
Admission: RE | Admit: 2021-12-04 | Discharge: 2021-12-04 | Disposition: A | Discharge status: Home / Self Care | Payer: BC Managed Care – PPO | Source: Ambulatory Visit | Attending: Internal Medicine | Admitting: Internal Medicine

## 2021-12-04 ENCOUNTER — Other Ambulatory Visit: Payer: Self-pay

## 2021-12-04 ENCOUNTER — Other Ambulatory Visit: Payer: BC Managed Care – PPO

## 2021-12-04 ENCOUNTER — Ambulatory Visit (INDEPENDENT_AMBULATORY_CARE_PROVIDER_SITE_OTHER): Payer: BC Managed Care – PPO | Admitting: Pharmacist

## 2021-12-04 DIAGNOSIS — Z113 Encounter for screening for infections with a predominantly sexual mode of transmission: Secondary | ICD-10-CM | POA: Diagnosis not present

## 2021-12-04 DIAGNOSIS — Z79899 Other long term (current) drug therapy: Secondary | ICD-10-CM

## 2021-12-04 MED ORDER — CABOTEGRAVIR ER 600 MG/3ML IM SUER
600.0000 mg | Freq: Once | INTRAMUSCULAR | Status: AC
  Administered 2021-12-04: 600 mg via INTRAMUSCULAR

## 2021-12-04 NOTE — Progress Notes (Signed)
HPI: Glenn Rivera is a 41 y.o. male who presents to the [facility] pharmacy clinic for Apretude administration and HIV PrEP follow up.  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  Modified Medications   No medications on file  Discontinued Medications   No medications on file    Allergies: Allergies  Allergen Reactions   Lactose Intolerance (Gi) Diarrhea and Other (See Comments)    Diarrhea, upset stomach, and migraine    Past Medical History: Past Medical History:  Diagnosis Date   Depression 03/25/2010   History   Eczema 2010   intermittent / seasonal on soles of feet bilaterally   Examination of participant or control in clinical research 02/12/2016    Social History: Social History   Socioeconomic History   Marital status: Single    Spouse name: Not on file   Number of children: Not on file   Years of education: Not on file   Highest education level: Not on file  Occupational History   Not on file  Tobacco Use   Smoking status: Never   Smokeless tobacco: Never  Substance and Sexual Activity   Alcohol use: Yes    Alcohol/week: 2.0 standard drinks of alcohol    Types: 2 Shots of liquor per week    Comment: 1x/month   Drug use: No   Sexual activity: Yes    Comment: Pansexual  Other Topics Concern   Not on file  Social History Narrative   Not on file   Social Determinants of Health   Financial Resource Strain: Not on file  Food Insecurity: Not on file  Transportation Needs: Not on file  Physical Activity: Not on file  Stress: Not  on file  Social Connections: Not on file    Labs: Lab Results  Component Value Date   HIV1RNAQUANT Not Detected 09/26/2021   HIV1RNAQUANT NOT DETECTED 07/25/2021   HIV1RNAQUANT Not Detected 05/23/2021    RPR and STI Lab Results  Component Value Date   LABRPR NON-REACTIVE 09/26/2021   LABRPR NON-REACTIVE 03/28/2021   LABRPR NON-REACTIVE 09/26/2020   LABRPR NON-REACTIVE 04/12/2020   LABRPR NON-REACTIVE 07/09/2019    STI Results GC GC CT CT  09/26/2021  9:58 AM Negative   Negative    07/25/2021 10:43 AM Negative    Negative   Negative    Negative    07/25/2021  9:26 AM Negative   Negative    03/28/2021 10:16 AM Negative   Negative    03/28/2021  9:56 AM Negative   Negative    01/31/2021  9:38 AM Negative   Negative    11/29/2020 10:34 AM Negative   Negative    11/29/2020 10:08 AM Negative    Negative   Negative    Negative    03/27/2018 12:00 AM **POSITIVE**   Negative    10/02/2016  8:10 AM  NOT DETECTED   NOT DETECTED   02/12/2016  9:00 AM  NOT DETECTED   NOT DETECTED     Hepatitis B Lab Results  Component Value Date   HEPBSAB NEG  02/12/2016   HEPBSAG NEGATIVE 02/01/2016   HEPBCAB NON REACTIVE 02/12/2016   Hepatitis C Lab Results  Component Value Date   HEPCAB NON-REACTIVE 09/26/2020   Hepatitis A Lab Results  Component Value Date   HAV NON-REACTIVE 09/26/2021   Lipids: Lab Results  Component Value Date   CHOL 133 02/09/2018   TRIG 46 02/09/2018   HDL 60 02/09/2018   CHOLHDL 2.2 02/09/2018   VLDL 8 02/12/2016   LDLCALC 60 02/09/2018    TARGET DATE: The 7th of the month  Current PrEP Regimen: Apretude  Assessment: Glenn Rivera presents today for their Apretude injection and to follow up for HIV PrEP. No issues with past injections. No known exposures to any STIs and no signs or symptoms of any STIs today. Last STI screening was 7/5 and was negative. Screened patient for acute HIV symptoms such as fatigue, muscle aches, rash, sore throat, lymphadenopathy,  headache, night sweats, nausea/vomiting/diarrhea, and fever. Patient denies any symptoms. One new partner since last injection; only vaginal intercourse. Rapid HIV blood test was drawn immediately prior to injection and was negative. Patient also had HIV RNA drawn today along with urine cytologies.  Administered cabotegravir 600mg /50mL in right upper outer quadrant of the gluteal muscle. Will make follow up appointments for maintenance injections every 2 months.   Plan: - Maintenance injections scheduled for 10/31 and 1/5 with me  - Check HIV RNA and urine cytologies  - Call with any issues or questions  11/31, PharmD, CPP, BCIDP Clinical Pharmacist Practitioner Infectious Diseases Clinical Pharmacist [facility]

## 2021-12-05 LAB — URINE CYTOLOGY ANCILLARY ONLY
Chlamydia: NEGATIVE
Comment: NEGATIVE
Comment: NORMAL
Neisseria Gonorrhea: NEGATIVE

## 2021-12-06 LAB — HIV-1 RNA QUANT-NO REFLEX-BLD
HIV 1 RNA Quant: NOT DETECTED Copies/mL
HIV-1 RNA Quant, Log: NOT DETECTED Log cps/mL

## 2022-01-22 ENCOUNTER — Other Ambulatory Visit (HOSPITAL_COMMUNITY)
Admission: RE | Admit: 2022-01-22 | Discharge: 2022-01-22 | Disposition: A | Discharge status: Home / Self Care | Payer: BC Managed Care – PPO | Source: Ambulatory Visit | Attending: Internal Medicine | Admitting: Internal Medicine

## 2022-01-22 ENCOUNTER — Ambulatory Visit (INDEPENDENT_AMBULATORY_CARE_PROVIDER_SITE_OTHER): Payer: BC Managed Care – PPO | Admitting: Pharmacist

## 2022-01-22 ENCOUNTER — Ambulatory Visit (INDEPENDENT_AMBULATORY_CARE_PROVIDER_SITE_OTHER): Payer: BC Managed Care – PPO

## 2022-01-22 ENCOUNTER — Other Ambulatory Visit: Payer: Self-pay

## 2022-01-22 ENCOUNTER — Other Ambulatory Visit: Payer: BC Managed Care – PPO

## 2022-01-22 DIAGNOSIS — Z23 Encounter for immunization: Secondary | ICD-10-CM

## 2022-01-22 DIAGNOSIS — Z79899 Other long term (current) drug therapy: Secondary | ICD-10-CM

## 2022-01-22 DIAGNOSIS — Z113 Encounter for screening for infections with a predominantly sexual mode of transmission: Secondary | ICD-10-CM

## 2022-01-22 MED ORDER — CABOTEGRAVIR ER 600 MG/3ML IM SUER
600.0000 mg | Freq: Once | INTRAMUSCULAR | Status: AC
  Administered 2022-01-22: 600 mg via INTRAMUSCULAR

## 2022-01-22 NOTE — Progress Notes (Signed)
HPI: Glenn Rivera is a 41 y.o. male who presents to the [facility] clinic for Apretude administration and HIV PrEP follow up.  Patient Active Problem List   Diagnosis Date Noted   Abscess of left lung with pneumonia ([place]) 02/29/2020   BOOP (bronchiolitis obliterans with organizing pneumonia) ([place]) 02/29/2020   Examination of participant or control in clinical research 02/12/2016    Patient's Medications  New Prescriptions   No medications on file  Previous Medications   CABOTEGRAVIR ER (APRETUDE) 600 MG/3ML INJECTION    Inject 3 mLs (600 mg total) into the muscle every 2 (two) months.  Modified Medications   No medications on file  Discontinued Medications   No medications on file    Allergies: Allergies  Allergen Reactions   Lactose Intolerance (Gi) Diarrhea and Other (See Comments)    Diarrhea, upset stomach, and migraine    Past Medical History: Past Medical History:  Diagnosis Date   Depression 03/25/2010   History   Eczema 2010   intermittent / seasonal on soles of feet bilaterally   Examination of participant or control in clinical research 02/12/2016    Social History: Social History   Socioeconomic History   Marital status: Single    Spouse name: Not on file   Number of children: Not on file   Years of education: Not on file   Highest education level: Not on file  Occupational History   Not on file  Tobacco Use   Smoking status: Never   Smokeless tobacco: Never  Substance and Sexual Activity   Alcohol use: Yes    Alcohol/week: 2.0 standard drinks of alcohol    Types: 2 Shots of liquor per week    Comment: 1x/month   Drug use: No   Sexual activity: Yes    Comment: Pansexual  Other Topics Concern   Not on file  Social History Narrative   Not on file   Social Determinants of Health   Financial Resource Strain: Not on file  Food Insecurity: Not on file  Transportation Needs: Not on file  Physical Activity: Not on file  Stress: Not  on file  Social Connections: Not on file    Labs: Lab Results  Component Value Date   HIV1RNAQUANT Not Detected 12/04/2021   HIV1RNAQUANT Not Detected 09/26/2021   HIV1RNAQUANT NOT DETECTED 07/25/2021    RPR and STI Lab Results  Component Value Date   LABRPR NON-REACTIVE 09/26/2021   LABRPR NON-REACTIVE 03/28/2021   LABRPR NON-REACTIVE 09/26/2020   LABRPR NON-REACTIVE 04/12/2020   LABRPR NON-REACTIVE 07/09/2019    STI Results GC GC CT CT  12/04/2021  9:49 AM Negative   Negative    09/26/2021  9:58 AM Negative   Negative    07/25/2021 10:43 AM Negative    Negative   Negative    Negative    07/25/2021  9:26 AM Negative   Negative    03/28/2021 10:16 AM Negative   Negative    03/28/2021  9:56 AM Negative   Negative    01/31/2021  9:38 AM Negative   Negative    11/29/2020 10:34 AM Negative   Negative    11/29/2020 10:08 AM Negative    Negative   Negative    Negative    03/27/2018 12:00 AM **POSITIVE**   Negative    10/02/2016  8:10 AM  NOT DETECTED   NOT DETECTED   02/12/2016  9:00 AM  NOT DETECTED   NOT DETECTED     Hepatitis  B Lab Results  Component Value Date   HEPBSAB NEG 02/12/2016   HEPBSAG NEGATIVE 02/01/2016   HEPBCAB NON REACTIVE 02/12/2016   Hepatitis C Lab Results  Component Value Date   HEPCAB NON-REACTIVE 09/26/2020   Hepatitis A Lab Results  Component Value Date   HAV NON-REACTIVE 09/26/2021   Lipids: Lab Results  Component Value Date   CHOL 133 02/09/2018   TRIG 46 02/09/2018   HDL 60 02/09/2018   CHOLHDL 2.2 02/09/2018   VLDL 8 02/12/2016   LDLCALC 60 02/09/2018    TARGET DATE: 7th  Assessment: Glenn Rivera presents today for his Apretude injection and to follow up for HIV PrEP. No issues with past injections and does not have any knots. Screened for acute HIV symptoms such as fatigue, muscle aches, rash, sore throat, lymphadenopathy, headache, night sweats, nausea/vomiting/diarrhea, and fever. Denies any symptoms. No signs or symptoms of any  STIs today. He is interested in STI screening today and requests urine and throat samples. Discussed that he does not have immunity for hepatitis A and B and he agrees to these vaccines today, as well as annual flu vaccine and updated Covid-19 vaccine. Of note, he did receive Tdap in 2017.   Per [employer] guidelines, a rapid HIV test should be drawn prior to Apretude administration. Due to state shortage of rapid HIV tests, this is temporarily unable to be done. Per decision from [facility] physicians, we will proceed with Apretude administration at this time without a negative rapid HIV test beforehand. HIV RNA was collected today and is in process.  Administered cabotegravir 600mg /44mL in right upper outer quadrant of the gluteal muscle. Injection was tolerated well without issue. Will see him back in 2 months for injection, labs, and HIV PrEP follow up.  Plan: - Apretude injection administered - HIV RNA today - Urine and oral cytologies, RPR  - Administered Havrix, Heplisav-B, Fluarix, Comirnaty - Next injection, labs, and PrEP follow up appointment scheduled for 03/29/22, 05/28/22 with 07/28/22 - Call with any issues or questions  Marchelle Folks, PharmD PGY1 Pharmacy Resident   01/22/2022  9:52 AM

## 2022-01-23 LAB — URINE CYTOLOGY ANCILLARY ONLY
Chlamydia: NEGATIVE
Comment: NEGATIVE
Comment: NORMAL
Neisseria Gonorrhea: NEGATIVE

## 2022-01-23 LAB — CYTOLOGY, (ORAL, ANAL, URETHRAL) ANCILLARY ONLY
Chlamydia: NEGATIVE
Comment: NEGATIVE
Comment: NORMAL
Neisseria Gonorrhea: NEGATIVE

## 2022-01-24 LAB — HIV-1 RNA QUANT-NO REFLEX-BLD
HIV 1 RNA Quant: NOT DETECTED Copies/mL
HIV-1 RNA Quant, Log: NOT DETECTED Log cps/mL

## 2022-01-24 LAB — RPR: RPR Ser Ql: NONREACTIVE

## 2022-03-29 ENCOUNTER — Other Ambulatory Visit: Payer: BC Managed Care – PPO

## 2022-03-29 ENCOUNTER — Other Ambulatory Visit (HOSPITAL_COMMUNITY)
Admission: RE | Admit: 2022-03-29 | Discharge: 2022-03-29 | Disposition: A | Discharge status: Home / Self Care | Payer: BC Managed Care – PPO | Source: Ambulatory Visit | Attending: Internal Medicine | Admitting: Internal Medicine

## 2022-03-29 ENCOUNTER — Other Ambulatory Visit: Payer: Self-pay

## 2022-03-29 ENCOUNTER — Ambulatory Visit (INDEPENDENT_AMBULATORY_CARE_PROVIDER_SITE_OTHER): Payer: BC Managed Care – PPO | Admitting: Pharmacist

## 2022-03-29 DIAGNOSIS — Z79899 Other long term (current) drug therapy: Secondary | ICD-10-CM

## 2022-03-29 DIAGNOSIS — Z113 Encounter for screening for infections with a predominantly sexual mode of transmission: Secondary | ICD-10-CM | POA: Insufficient documentation

## 2022-03-29 DIAGNOSIS — Z2981 Encounter for HIV pre-exposure prophylaxis: Secondary | ICD-10-CM

## 2022-03-29 DIAGNOSIS — Z23 Encounter for immunization: Secondary | ICD-10-CM | POA: Diagnosis not present

## 2022-03-29 MED ORDER — CABOTEGRAVIR ER 600 MG/3ML IM SUER
600.0000 mg | Freq: Once | INTRAMUSCULAR | Status: AC
  Administered 2022-03-29: 600 mg via INTRAMUSCULAR

## 2022-03-29 NOTE — Progress Notes (Signed)
HPI: Glenn Rivera is a 42 y.o. male who presents to the [facility] clinic for Apretude administration and HIV PrEP follow up.  Patient Active Problem List   Diagnosis Date Noted   Abscess of left lung with pneumonia ([place]) 02/29/2020   BOOP (bronchiolitis obliterans with organizing pneumonia) ([place]) 02/29/2020   Examination of participant or control in clinical research 02/12/2016    Patient's Medications  New Prescriptions   No medications on file  Previous Medications   CABOTEGRAVIR ER (APRETUDE) 600 MG/3ML INJECTION    Inject 3 mLs (600 mg total) into the muscle every 2 (two) months.  Modified Medications   No medications on file  Discontinued Medications   No medications on file    Allergies: Allergies  Allergen Reactions   Lactose Intolerance (Gi) Diarrhea and Other (See Comments)    Diarrhea, upset stomach, and migraine    Past Medical History: Past Medical History:  Diagnosis Date   Depression 03/25/2010   History   Eczema 2010   intermittent / seasonal on soles of feet bilaterally   Examination of participant or control in clinical research 02/12/2016    Social History: Social History   Socioeconomic History   Marital status: Single    Spouse name: Not on file   Number of children: Not on file   Years of education: Not on file   Highest education level: Not on file  Occupational History   Not on file  Tobacco Use   Smoking status: Never   Smokeless tobacco: Never  Substance and Sexual Activity   Alcohol use: Yes    Alcohol/week: 2.0 standard drinks of alcohol    Types: 2 Shots of liquor per week    Comment: 1x/month   Drug use: No   Sexual activity: Yes    Comment: Pansexual  Other Topics Concern   Not on file  Social History Narrative   Not on file   Social Determinants of Health   Financial Resource Strain: Not on file  Food Insecurity: Not on file  Transportation Needs: Not on file  Physical Activity: Not on file  Stress: Not  on file  Social Connections: Not on file    Labs: Lab Results  Component Value Date   HIV1RNAQUANT Not Detected 01/22/2022   HIV1RNAQUANT Not Detected 12/04/2021   HIV1RNAQUANT Not Detected 09/26/2021    RPR and STI Lab Results  Component Value Date   LABRPR NON-REACTIVE 01/22/2022   LABRPR NON-REACTIVE 09/26/2021   LABRPR NON-REACTIVE 03/28/2021   LABRPR NON-REACTIVE 09/26/2020   LABRPR NON-REACTIVE 04/12/2020    STI Results GC GC CT CT  01/22/2022  9:31 AM Negative    Negative   Negative    Negative    12/04/2021  9:49 AM Negative   Negative    09/26/2021  9:58 AM Negative   Negative    07/25/2021 10:43 AM Negative    Negative   Negative    Negative    07/25/2021  9:26 AM Negative   Negative    03/28/2021 10:16 AM Negative   Negative    03/28/2021  9:56 AM Negative   Negative    01/31/2021  9:38 AM Negative   Negative    11/29/2020 10:34 AM Negative   Negative    11/29/2020 10:08 AM Negative    Negative   Negative    Negative    03/27/2018 12:00 AM **POSITIVE**   Negative    10/02/2016  8:10 AM  NOT DETECTED   NOT  DETECTED   02/12/2016  9:00 AM  NOT DETECTED   NOT DETECTED     Hepatitis B Lab Results  Component Value Date   HEPBSAB NEG 02/12/2016   HEPBSAG NEGATIVE 02/01/2016   HEPBCAB NON REACTIVE 02/12/2016   Hepatitis C Lab Results  Component Value Date   HEPCAB NON-REACTIVE 09/26/2020   Hepatitis A Lab Results  Component Value Date   HAV NON-REACTIVE 09/26/2021   Lipids: Lab Results  Component Value Date   CHOL 133 02/09/2018   TRIG 46 02/09/2018   HDL 60 02/09/2018   CHOLHDL 2.2 02/09/2018   VLDL 8 02/12/2016   LDLCALC 60 02/09/2018    TARGET DATE: The 7th of the month  Current PrEP Regimen: Apretude  Assessment: Glenn Rivera presents today for their Apretude injection and to follow up for HIV PrEP. No issues with past injections. No known exposures to any STIs and no signs or symptoms of any STIs today. Last STI screening was 01/22/22 and was  negative. Screened patient for acute HIV symptoms such as fatigue, muscle aches, rash, sore throat, lymphadenopathy, headache, night sweats, nausea/vomiting/diarrhea, and fever. Patient denies any symptoms. No new partners since last injection. Will check urine cytologies today.   Per [employer] guidelines, a rapid HIV test should be drawn prior to Apretude administration. Due to state shortage of rapid HIV tests, this is temporarily unable to be done. Per decision from [place], we will proceed with Apretude administration at this time without a negative rapid HIV test beforehand. HIV RNA was collected today and is in process.  Administered cabotegravir 600mg /51mL in right upper outer quadrant of the gluteal muscle. Will make follow up appointments for maintenance injections every 2 months.   Administered 2nd Heplisav vaccine today.  Plan: - Maintenance injections scheduled for 3/5 with me  - Check HIV RNA and urine cytologies - Administer 2nd HBV vaccine - Call with any issues or questions  Alfonse Spruce, PharmD, CPP, BCIDP, [place] Clinical Pharmacist Practitioner Infectious [facility]

## 2022-03-31 LAB — HIV-1 RNA QUANT-NO REFLEX-BLD
HIV 1 RNA Quant: NOT DETECTED Copies/mL
HIV-1 RNA Quant, Log: NOT DETECTED Log cps/mL

## 2022-04-01 LAB — URINE CYTOLOGY ANCILLARY ONLY
Chlamydia: NEGATIVE
Comment: NEGATIVE
Comment: NORMAL
Neisseria Gonorrhea: NEGATIVE

## 2022-05-28 ENCOUNTER — Other Ambulatory Visit: Payer: Self-pay

## 2022-05-28 ENCOUNTER — Ambulatory Visit (INDEPENDENT_AMBULATORY_CARE_PROVIDER_SITE_OTHER): Payer: BC Managed Care – PPO | Admitting: Pharmacist

## 2022-05-28 ENCOUNTER — Other Ambulatory Visit (HOSPITAL_COMMUNITY)
Admission: RE | Admit: 2022-05-28 | Discharge: 2022-05-28 | Disposition: A | Discharge status: Home / Self Care | Payer: BC Managed Care – PPO | Source: Ambulatory Visit | Attending: Internal Medicine | Admitting: Internal Medicine

## 2022-05-28 DIAGNOSIS — Z2981 Encounter for HIV pre-exposure prophylaxis: Secondary | ICD-10-CM

## 2022-05-28 DIAGNOSIS — Z113 Encounter for screening for infections with a predominantly sexual mode of transmission: Secondary | ICD-10-CM | POA: Diagnosis present

## 2022-05-28 DIAGNOSIS — Z79899 Other long term (current) drug therapy: Secondary | ICD-10-CM

## 2022-05-28 DIAGNOSIS — Z23 Encounter for immunization: Secondary | ICD-10-CM

## 2022-05-28 MED ORDER — CABOTEGRAVIR ER 600 MG/3ML IM SUER
600.0000 mg | Freq: Once | INTRAMUSCULAR | Status: AC
  Administered 2022-05-28: 600 mg via INTRAMUSCULAR

## 2022-05-28 NOTE — Progress Notes (Signed)
HPI: Glenn Rivera is a 42 y.o. male who presents to the [facility] clinic for Apretude administration and HIV PrEP follow up.  Patient Active Problem List   Diagnosis Date Noted   Abscess of left lung with pneumonia ([place]) 02/29/2020   BOOP (bronchiolitis obliterans with organizing pneumonia) ([place]) 02/29/2020   Examination of participant or control in clinical research 02/12/2016    Patient's Medications  New Prescriptions   No medications on file  Previous Medications   CABOTEGRAVIR ER (APRETUDE) 600 MG/3ML INJECTION    Inject 3 mLs (600 mg total) into the muscle every 2 (two) months.  Modified Medications   No medications on file  Discontinued Medications   No medications on file    Allergies: Allergies  Allergen Reactions   Lactose Intolerance (Gi) Diarrhea and Other (See Comments)    Diarrhea, upset stomach, and migraine    Past Medical History: Past Medical History:  Diagnosis Date   Depression 03/25/2010   History   Eczema 2010   intermittent / seasonal on soles of feet bilaterally   Examination of participant or control in clinical research 02/12/2016    Social History: Social History   Socioeconomic History   Marital status: Single    Spouse name: Not on file   Number of children: Not on file   Years of education: Not on file   Highest education level: Not on file  Occupational History   Not on file  Tobacco Use   Smoking status: Never   Smokeless tobacco: Never  Substance and Sexual Activity   Alcohol use: Yes    Alcohol/week: 2.0 standard drinks of alcohol    Types: 2 Shots of liquor per week    Comment: 1x/month   Drug use: No   Sexual activity: Yes    Comment: Pansexual  Other Topics Concern   Not on file  Social History Narrative   Not on file   Social Determinants of Health   Financial Resource Strain: Not on file  Food Insecurity: Not on file  Transportation Needs: Not on file  Physical Activity: Not on file  Stress: Not  on file  Social Connections: Not on file    Labs: Lab Results  Component Value Date   HIV1RNAQUANT Not Detected 03/29/2022   HIV1RNAQUANT Not Detected 01/22/2022   HIV1RNAQUANT Not Detected 12/04/2021    RPR and STI Lab Results  Component Value Date   LABRPR NON-REACTIVE 01/22/2022   LABRPR NON-REACTIVE 09/26/2021   LABRPR NON-REACTIVE 03/28/2021   LABRPR NON-REACTIVE 09/26/2020   LABRPR NON-REACTIVE 04/12/2020    STI Results GC GC CT CT  03/29/2022 10:10 AM Negative   Negative    01/22/2022  9:31 AM Negative    Negative   Negative    Negative    12/04/2021  9:49 AM Negative   Negative    09/26/2021  9:58 AM Negative   Negative    07/25/2021 10:43 AM Negative    Negative   Negative    Negative    07/25/2021  9:26 AM Negative   Negative    03/28/2021 10:16 AM Negative   Negative    03/28/2021  9:56 AM Negative   Negative    01/31/2021  9:38 AM Negative   Negative    11/29/2020 10:34 AM Negative   Negative    11/29/2020 10:08 AM Negative    Negative   Negative    Negative    03/27/2018 12:00 AM **POSITIVE**   Negative  10/02/2016  8:10 AM  NOT DETECTED   NOT DETECTED   02/12/2016  9:00 AM  NOT DETECTED   NOT DETECTED     Hepatitis B Lab Results  Component Value Date   HEPBSAB NEG 02/12/2016   HEPBSAG NEGATIVE 02/01/2016   HEPBCAB NON REACTIVE 02/12/2016   Hepatitis C Lab Results  Component Value Date   HEPCAB NON-REACTIVE 09/26/2020   Hepatitis A Lab Results  Component Value Date   HAV NON-REACTIVE 09/26/2021   Lipids: Lab Results  Component Value Date   CHOL 133 02/09/2018   TRIG 46 02/09/2018   HDL 60 02/09/2018   CHOLHDL 2.2 02/09/2018   VLDL 8 02/12/2016   LDLCALC 60 02/09/2018    TARGET DATE: The 7th of the month  Current PrEP Regimen: Apretude  Assessment: Glenn Rivera presents today for their Apretude injection and to follow up for HIV PrEP. No issues with past injections. No known exposures to any STIs and no signs or symptoms of any STIs  today. Last STI screening was 03/29/22 and was negative. Screened patient for acute HIV symptoms such as fatigue, muscle aches, rash, sore throat, lymphadenopathy, headache, night sweats, nausea/vomiting/diarrhea, and fever. Patient denies any symptoms. No new partners since last injection. Still requests urine/oral cytologies today. Will recheck HBV sAb as recently completed HBV vaccine series. Due for last HAV at next visit. Will ask about HPV at that time as well.   Per [employer] guidelines, a rapid HIV test should be drawn prior to Apretude administration. Due to state shortage of rapid HIV tests, this is temporarily unable to be done. Per decision from [place], we will proceed with Apretude administration at this time without a negative rapid HIV test beforehand. HIV RNA was collected today and is in process.  Administered cabotegravir '600mg'$ /69m in left upper outer quadrant of the gluteal muscle. Will make follow up appointments for maintenance injections every 2 months.   Plan: - Maintenance injections scheduled for 4/30 - Check HIV RNA, HBV sAb, and urine/oral cytologies  - Call with any issues or questions  AAlfonse Spruce PharmD, CPP, BCIDP, AAHIVP Clinical Pharmacist Practitioner Infectious Diseases [facility]

## 2022-05-29 ENCOUNTER — Encounter: Payer: Self-pay | Admitting: Pharmacist

## 2022-05-29 LAB — CYTOLOGY, (ORAL, ANAL, URETHRAL) ANCILLARY ONLY
Chlamydia: NEGATIVE
Comment: NEGATIVE
Comment: NORMAL
Neisseria Gonorrhea: POSITIVE — AB

## 2022-05-29 LAB — URINE CYTOLOGY ANCILLARY ONLY
Chlamydia: NEGATIVE
Comment: NEGATIVE
Comment: NORMAL
Neisseria Gonorrhea: NEGATIVE

## 2022-05-30 ENCOUNTER — Other Ambulatory Visit: Payer: Self-pay

## 2022-05-30 ENCOUNTER — Ambulatory Visit (INDEPENDENT_AMBULATORY_CARE_PROVIDER_SITE_OTHER): Payer: BC Managed Care – PPO | Admitting: Pharmacist

## 2022-05-30 DIAGNOSIS — A549 Gonococcal infection, unspecified: Secondary | ICD-10-CM

## 2022-05-30 MED ORDER — CEFTRIAXONE SODIUM 500 MG IJ SOLR
500.0000 mg | Freq: Once | INTRAMUSCULAR | Status: AC
  Administered 2022-05-30: 500 mg via INTRAMUSCULAR

## 2022-05-30 NOTE — Progress Notes (Signed)
   05/30/2022  HPI: Glenn Rivera is a 42 y.o. male who presents to the [facility] clinic today for STI testing.  Patient Active Problem List   Diagnosis Date Noted   Abscess of left lung with pneumonia ([place]) 02/29/2020   BOOP (bronchiolitis obliterans with organizing pneumonia) ([place]) 02/29/2020   Examination of participant or control in clinical research 02/12/2016    Patient's Medications  New Prescriptions   No medications on file  Previous Medications   CABOTEGRAVIR ER (APRETUDE) 600 MG/3ML INJECTION    Inject 3 mLs (600 mg total) into the muscle every 2 (two) months.  Modified Medications   No medications on file  Discontinued Medications   No medications on file    Allergies: Allergies  Allergen Reactions   Lactose Intolerance (Gi) Diarrhea and Other (See Comments)    Diarrhea, upset stomach, and migraine    Past Medical History: Past Medical History:  Diagnosis Date   Depression 03/25/2010   History   Eczema 2010   intermittent / seasonal on soles of feet bilaterally   Examination of participant or control in clinical research 02/12/2016    Social History: Social History   Socioeconomic History   Marital status: Single    Spouse name: Not on file   Number of children: Not on file   Years of education: Not on file   Highest education level: Not on file  Occupational History   Not on file  Tobacco Use   Smoking status: Never   Smokeless tobacco: Never  Substance and Sexual Activity   Alcohol use: Yes    Alcohol/week: 2.0 standard drinks of alcohol    Types: 2 Shots of liquor per week    Comment: 1x/month   Drug use: No   Sexual activity: Yes    Comment: Pansexual  Other Topics Concern   Not on file  Social History Narrative   Not on file   Social Determinants of Health   Financial Resource Strain: Not on file  Food Insecurity: Not on file  Transportation Needs: Not on file  Physical Activity: Not on file  Stress: Not on file  Social  Connections: Not on file     Assessment: [name] presents today for treatment of oral gonorrhea after positive oral cytology from 3/5 appointment. Patient reports no recent sexual activity and minimal symptoms--we discussed the possibility of the bacteria lingering for a period of time prior to symptoms presenting. Gave Ceftriaxone '500mg'$  x1 IM into the right upper outer gluteal muscle. Patient tolerated well. Patient was counseled to refrain from sexual activity for 1 week.   Plan: - Gave ceftriaxone '500mg'$  IM  - F/u with Estill Bamberg on 3/14 for confirmation of negative oral swab    Billey [place], PharmD PGY1 Pharmacy Resident 3/7/20249:23 AM

## 2022-05-31 LAB — HEPATITIS B SURFACE ANTIBODY,QUALITATIVE: Hep B S Ab: REACTIVE — AB

## 2022-05-31 LAB — HIV-1 RNA QUANT-NO REFLEX-BLD
HIV 1 RNA Quant: NOT DETECTED Copies/mL
HIV-1 RNA Quant, Log: NOT DETECTED Log cps/mL

## 2022-06-06 ENCOUNTER — Other Ambulatory Visit: Payer: Self-pay

## 2022-06-06 ENCOUNTER — Other Ambulatory Visit (HOSPITAL_COMMUNITY)
Admission: RE | Admit: 2022-06-06 | Discharge: 2022-06-06 | Disposition: A | Discharge status: Home / Self Care | Payer: BC Managed Care – PPO | Source: Ambulatory Visit | Attending: Infectious Disease | Admitting: Infectious Disease

## 2022-06-06 ENCOUNTER — Ambulatory Visit (INDEPENDENT_AMBULATORY_CARE_PROVIDER_SITE_OTHER): Payer: BC Managed Care – PPO | Admitting: Pharmacist

## 2022-06-06 DIAGNOSIS — A549 Gonococcal infection, unspecified: Secondary | ICD-10-CM | POA: Insufficient documentation

## 2022-06-06 NOTE — Progress Notes (Addendum)
   [facility] Pharmacy STI Visit  HPI: Glenn Rivera is a 42 y.o. male who presents to the [facility] clinic for pharyngeal gonorrhea test of cure.  Hepatitis B Lab Results  Component Value Date   HEPBSAB REACTIVE (A) 05/28/2022   Lab Results  Component Value Date   HEPBSAG NEGATIVE 02/01/2016    Hepatitis C Lab Results  Component Value Date   HCVAB NEGATIVE 02/01/2016    Hepatitis A Lab Results  Component Value Date   HAV NON-REACTIVE 09/26/2021    Assessment & Plan: - Collected test of cure oral swab  - Patient denies any sexual activity or issues with ceftriaxone injection   Alfonse Spruce, PharmD, CPP, BCIDP, [place] Clinical Pharmacist Practitioner Infectious Diseases [facility] 06/06/2022, 4:11 PM

## 2022-06-08 ENCOUNTER — Encounter (INDEPENDENT_AMBULATORY_CARE_PROVIDER_SITE_OTHER): Payer: Self-pay

## 2022-06-10 LAB — CYTOLOGY, (ORAL, ANAL, URETHRAL) ANCILLARY ONLY
Chlamydia: NEGATIVE
Comment: NEGATIVE
Comment: NORMAL
Neisseria Gonorrhea: NEGATIVE

## 2022-07-11 IMAGING — CR DG CHEST 2V
1 series · 1 of 1 positions shown · non-contrast
Comparison: None.

CLINICAL DATA: 39-year-old male with shortness of breath.

EXAM:
CHEST - 2 VIEW

[chest pa]
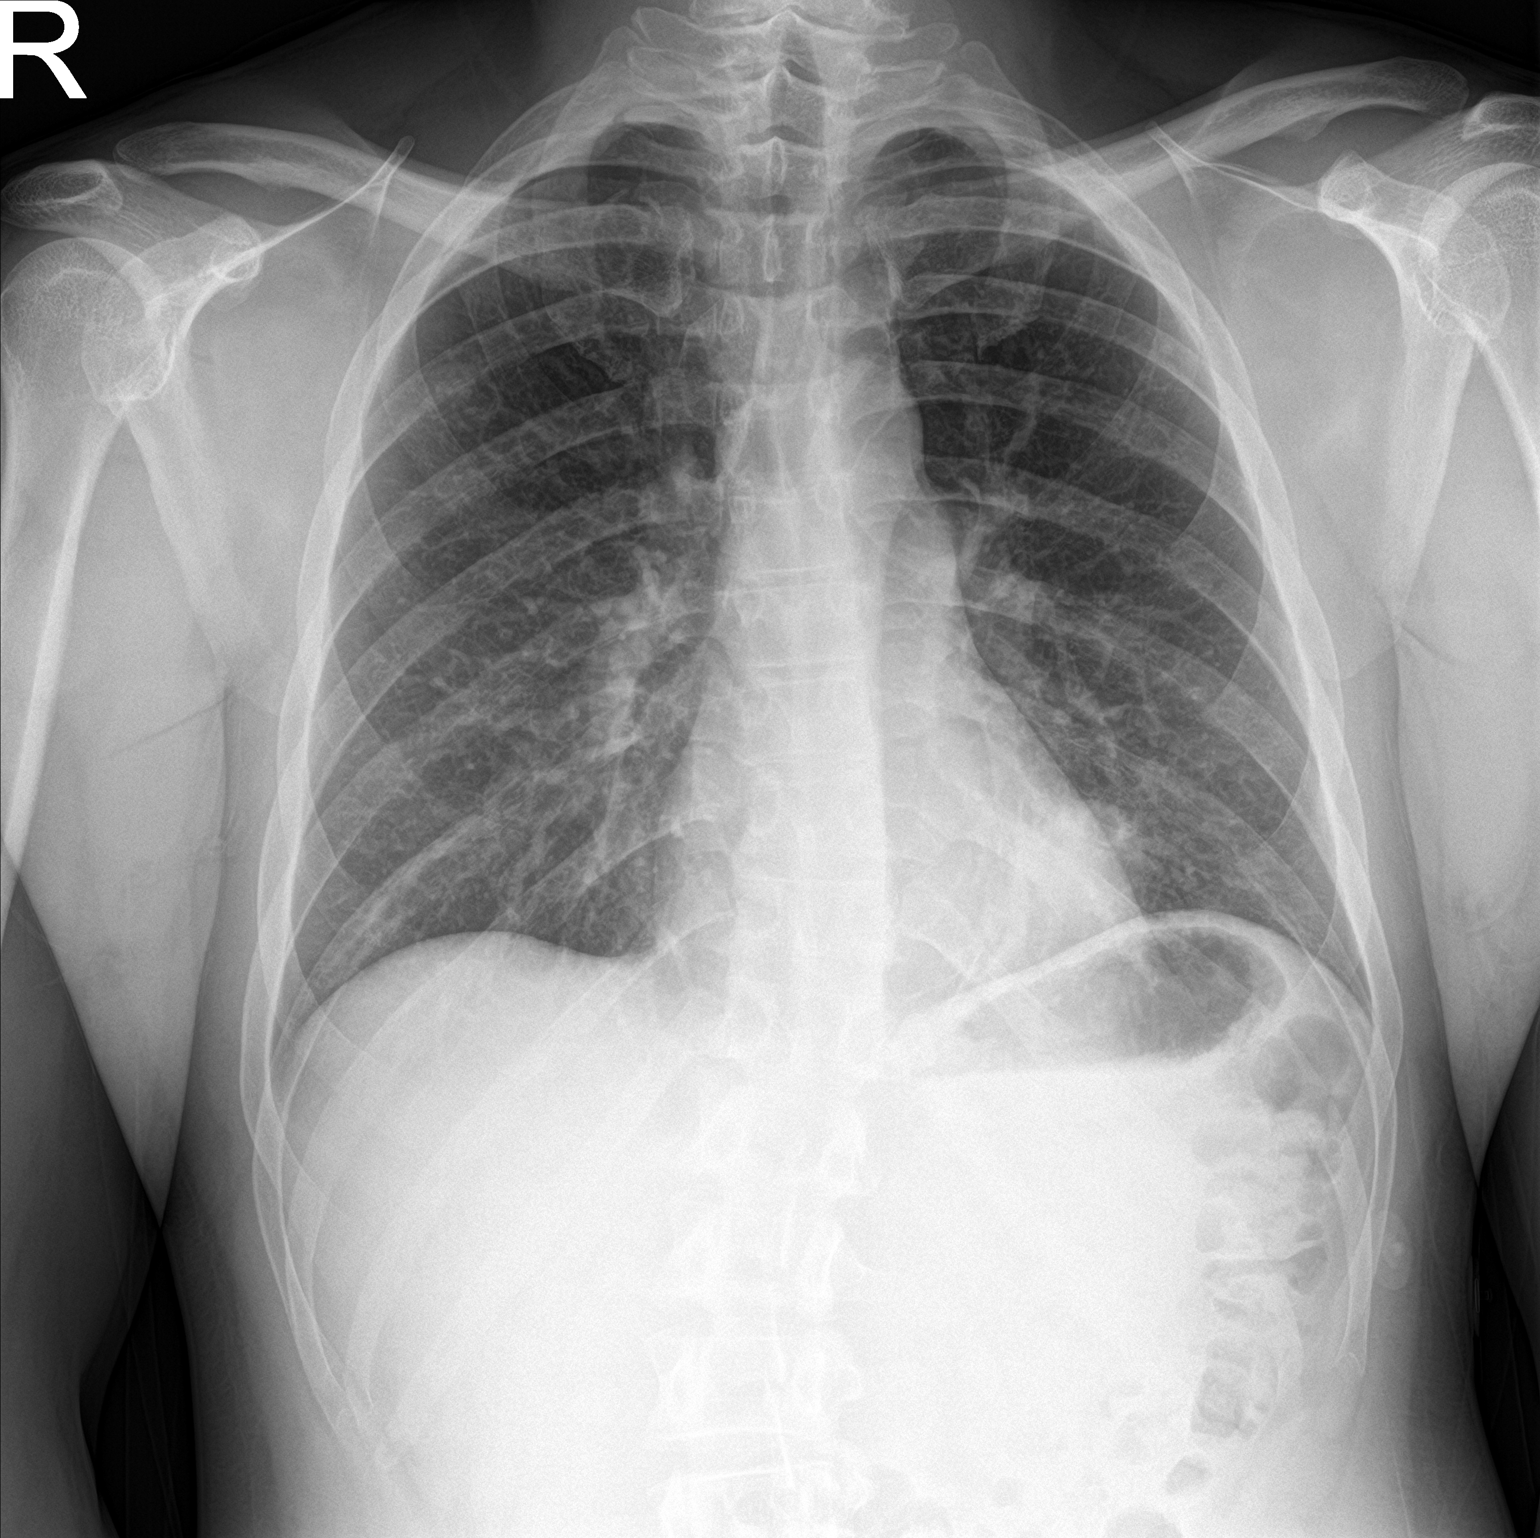

[1 of 1 positions shown; findings below may reference images not displayed]

FINDINGS: Faint bilateral posterior lower lung field densities may represent
atelectasis. Atypical infection is not excluded clinical correlation
is recommended. No lobar consolidation, pleural effusion, or
pneumothorax. The cardiac silhouette is within limits. No acute
osseous pathology.
IMPRESSION: No focal consolidation.  Probable bilateral atelectasis.

## 2022-07-15 ENCOUNTER — Telehealth: Payer: Self-pay

## 2022-07-15 NOTE — Telephone Encounter (Signed)
[facility] Patient Advocate Encounter  Apretude  The requested medication does not require prior authorization review.  [employer] [place]  The notification will be in Media/  Rehaan Viloria, CPhT Specialty Pharmacy Patient Advocate [facility] Phone: [phone] Fax:  [phone]  

## 2022-07-23 ENCOUNTER — Ambulatory Visit (INDEPENDENT_AMBULATORY_CARE_PROVIDER_SITE_OTHER): Payer: BC Managed Care – PPO | Admitting: Pharmacist

## 2022-07-23 ENCOUNTER — Other Ambulatory Visit (HOSPITAL_COMMUNITY)
Admission: RE | Admit: 2022-07-23 | Discharge: 2022-07-23 | Disposition: A | Discharge status: Home / Self Care | Payer: BC Managed Care – PPO | Source: Ambulatory Visit | Attending: Infectious Disease | Admitting: Infectious Disease

## 2022-07-23 ENCOUNTER — Other Ambulatory Visit: Payer: Self-pay

## 2022-07-23 DIAGNOSIS — Z79899 Other long term (current) drug therapy: Secondary | ICD-10-CM | POA: Diagnosis present

## 2022-07-23 DIAGNOSIS — Z2981 Encounter for HIV pre-exposure prophylaxis: Secondary | ICD-10-CM | POA: Diagnosis not present

## 2022-07-23 DIAGNOSIS — Z23 Encounter for immunization: Secondary | ICD-10-CM

## 2022-07-23 DIAGNOSIS — Z206 Contact with and (suspected) exposure to human immunodeficiency virus [HIV]: Secondary | ICD-10-CM | POA: Diagnosis not present

## 2022-07-23 MED ORDER — CABOTEGRAVIR ER 600 MG/3ML IM SUER
600.0000 mg | Freq: Once | INTRAMUSCULAR | Status: AC
  Administered 2022-07-23: 600 mg via INTRAMUSCULAR

## 2022-07-23 NOTE — Progress Notes (Signed)
HPI: Glenn Rivera is a 42 y.o. male who presents to the [facility] pharmacy clinic for Apretude administration and HIV PrEP follow up.  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  Modified Medications   No medications on file  Discontinued Medications   No medications on file    Allergies: Allergies  Allergen Reactions   Lactose Intolerance (Gi) Diarrhea and Other (See Comments)    Diarrhea, upset stomach, and migraine    Past Medical History: Past Medical History:  Diagnosis Date   Depression 03/25/2010   History   Eczema 2010   intermittent / seasonal on soles of feet bilaterally   Examination of participant or control in clinical research 02/12/2016    Social History: Social History   Socioeconomic History   Marital status: Single    Spouse name: Not on file   Number of children: Not on file   Years of education: Not on file   Highest education level: Not on file  Occupational History   Not on file  Tobacco Use   Smoking status: Never   Smokeless tobacco: Never  Substance and Sexual Activity   Alcohol use: Yes    Alcohol/week: 2.0 standard drinks of alcohol    Types: 2 Shots of liquor per week    Comment: 1x/month   Drug use: No   Sexual activity: Yes    Comment: Pansexual  Other Topics Concern   Not on file  Social History Narrative   Not on file   Social Determinants of Health   Financial Resource Strain: Not on file  Food Insecurity: Not on file  Transportation Needs: Not on file  Physical Activity: Not on file  Stress: Not  on file  Social Connections: Not on file    Labs: Lab Results  Component Value Date   HIV1RNAQUANT Not Detected 05/28/2022   HIV1RNAQUANT Not Detected 03/29/2022   HIV1RNAQUANT Not Detected 01/22/2022    RPR and STI Lab Results  Component Value Date   LABRPR NON-REACTIVE 01/22/2022   LABRPR NON-REACTIVE 09/26/2021   LABRPR NON-REACTIVE 03/28/2021   LABRPR NON-REACTIVE 09/26/2020   LABRPR NON-REACTIVE 04/12/2020    STI Results GC GC CT CT  06/06/2022  3:57 PM Negative   Negative    05/28/2022  9:14 AM Positive   Negative    05/28/2022  9:06 AM Negative   Negative    03/29/2022 10:10 AM Negative   Negative    01/22/2022  9:31 AM Negative    Negative   Negative    Negative    12/04/2021  9:49 AM Negative   Negative    09/26/2021  9:58 AM Negative   Negative    07/25/2021 10:43 AM Negative    Negative   Negative    Negative    07/25/2021  9:26 AM Negative   Negative    03/28/2021 10:16 AM Negative   Negative    03/28/2021  9:56 AM Negative   Negative    01/31/2021  9:38 AM Negative   Negative    11/29/2020 10:34 AM Negative  Negative    11/29/2020 10:08 AM Negative    Negative   Negative    Negative    03/27/2018 12:00 AM **POSITIVE**   Negative    10/02/2016  8:10 AM  NOT DETECTED   NOT DETECTED   02/12/2016  9:00 AM  NOT DETECTED   NOT DETECTED     Hepatitis B Lab Results  Component Value Date   HEPBSAB REACTIVE (A) 05/28/2022   HEPBSAG NEGATIVE 02/01/2016   HEPBCAB NON REACTIVE 02/12/2016   Hepatitis C Lab Results  Component Value Date   HEPCAB NON-REACTIVE 09/26/2020   Hepatitis A Lab Results  Component Value Date   HAV NON-REACTIVE 09/26/2021   Lipids: Lab Results  Component Value Date   CHOL 133 02/09/2018   TRIG 46 02/09/2018   HDL 60 02/09/2018   CHOLHDL 2.2 02/09/2018   VLDL 8 02/12/2016   LDLCALC 60 02/09/2018    TARGET DATE: The 7th of the month  Current PrEP Regimen: Apretude  Assessment: Glenn Rivera presents today for their Apretude  injection and to follow up for HIV PrEP. No issues with past injections. No known exposures to any STIs and no signs or symptoms of any STIs today. Recently treated for pharyngeal gonorrhea with negative test of cure. Screened patient for acute HIV symptoms such as fatigue, muscle aches, rash, sore throat, lymphadenopathy, headache, night sweats, nausea/vomiting/diarrhea, and fever. Patient denies any symptoms. No new partners since last injection; still requests urine/oral cytologies today.   Per [employer] guidelines, a rapid HIV test should be drawn prior to Apretude administration. Due to state shortage of rapid HIV tests, this is temporarily unable to be done. Per decision from [facility] physicians, we will proceed with Apretude administration at this time without a negative rapid HIV test beforehand. HIV RNA was collected today and is in process.  Administered cabotegravir 600mg /81mL in right upper outer quadrant of the gluteal muscle. Will make follow up appointments for maintenance injections every 2 months.   Patient due for second HAV vaccine which was administered today.  Plan: - Maintenance injections scheduled for 7/2 with me  - Check HIV RNA, urine/oral cytologies  - Administer 2nd Havrix  - Call with any issues or questions  Margarite Gouge, PharmD, CPP, BCIDP, AAHIVP Clinical Pharmacist Practitioner Infectious Diseases Clinical Pharmacist [facility]

## 2022-07-24 LAB — URINE CYTOLOGY ANCILLARY ONLY
Chlamydia: NEGATIVE
Comment: NEGATIVE
Comment: NORMAL
Neisseria Gonorrhea: NEGATIVE

## 2022-07-24 LAB — CYTOLOGY, (ORAL, ANAL, URETHRAL) ANCILLARY ONLY
Chlamydia: NEGATIVE
Comment: NEGATIVE
Comment: NORMAL
Neisseria Gonorrhea: NEGATIVE

## 2022-07-26 LAB — HIV-1 RNA QUANT-NO REFLEX-BLD
HIV 1 RNA Quant: NOT DETECTED Copies/mL
HIV-1 RNA Quant, Log: NOT DETECTED Log cps/mL

## 2022-09-07 ENCOUNTER — Ambulatory Visit
Admission: EM | Admit: 2022-09-07 | Discharge: 2022-09-07 | Disposition: A | Discharge status: Home / Self Care | Payer: BC Managed Care – PPO | Attending: Nurse Practitioner | Admitting: Nurse Practitioner

## 2022-09-07 DIAGNOSIS — J029 Acute pharyngitis, unspecified: Secondary | ICD-10-CM | POA: Insufficient documentation

## 2022-09-07 DIAGNOSIS — Z9109 Other allergy status, other than to drugs and biological substances: Secondary | ICD-10-CM | POA: Diagnosis present

## 2022-09-07 DIAGNOSIS — Z1152 Encounter for screening for COVID-19: Secondary | ICD-10-CM | POA: Diagnosis not present

## 2022-09-07 LAB — POCT RAPID STREP A (OFFICE): Rapid Strep A Screen: NEGATIVE

## 2022-09-07 NOTE — ED Triage Notes (Signed)
Pt presents with c/o sore throat X 4 days.  States the sore throat is worse in the mornings and has redness   Denies cough, fever, congestion.

## 2022-09-07 NOTE — ED Provider Notes (Signed)
[facility]    CSN: [id number] Arrival date & time: 09/07/22  0808      History   Chief Complaint Chief Complaint  Patient presents with   Sore Throat    HPI Glenn Rivera is a 42 y.o. male  presents for evaluation of URI symptoms for 4 days. Patient reports associated symptoms of sore throat and ear pain. Denies N/V/D, fevers, cough, congestion, body aches, shortness of breath. Patient does not have a hx of asthma or smoking. No known sick contacts.  Pt has taken nothing OTC for symptoms. Pt has no other concerns at this time.    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 No pertinent surgical history.     Home Medications    Prior to Admission medications   Medication Sig Start Date End Date Taking? Authorizing Provider  cabotegravir ER (APRETUDE) 600 MG/3ML injection Inject 3 mLs (600 mg total) into the muscle every 2 (two) months. 11/29/20   Jennette Kettle, RPH-CPP    Family History History reviewed. No pertinent family history.  Social History Social History   Tobacco Use   Smoking status: Never   Smokeless tobacco: Never  Substance Use Topics   Alcohol use: Yes    Alcohol/week: 2.0 standard drinks of alcohol    Types: 2 Shots of liquor per week    Comment: 1x/month   Drug use: No     Allergies   Lactose intolerance (gi)   Review of Systems Review of Systems  HENT:  Positive for ear pain and sore throat.      Physical Exam Triage Vital Signs ED Triage Vitals   Enc Vitals Group     BP 09/07/22 0819 132/86     Pulse Rate 09/07/22 0818 74     Resp 09/07/22 0818 19     Temp 09/07/22 0818 98.2 F (36.8 C)     Temp Source 09/07/22 0818 Oral     SpO2 09/07/22 0818 97 %     Weight --      Height --      Head Circumference --      Peak Flow --      Pain Score 09/07/22 0817 1     Pain Loc --      Pain Edu? --      Excl. in GC? --    No data found.  Updated Vital Signs BP 132/86   Pulse 74   Temp 98.2 F (36.8 C) (Oral)   Resp 19   SpO2 97%   Visual Acuity Right Eye Distance:   Left Eye Distance:   Bilateral Distance:    Right Eye Near:   Left Eye Near:    Bilateral Near:     Physical Exam Vitals and nursing note reviewed.  Constitutional:      General: He is not in acute distress.    Appearance: Normal appearance. He is not ill-appearing or toxic-appearing.  HENT:     Head: Normocephalic and atraumatic.  Right Ear: Tympanic membrane and ear canal normal.     Left Ear: Tympanic membrane and ear canal normal.     Nose: No congestion or rhinorrhea.     Mouth/Throat:     Mouth: Mucous membranes are moist.     Pharynx: Posterior oropharyngeal erythema present.  Eyes:     Pupils: Pupils are equal, round, and reactive to light.  Cardiovascular:     Rate and Rhythm: Normal rate and regular rhythm.     Heart sounds: Normal heart sounds.  Pulmonary:     Effort: Pulmonary effort is normal.     Breath sounds: Normal breath sounds.  Musculoskeletal:     Cervical back: Normal range of motion and neck supple.  Lymphadenopathy:     Cervical: No cervical adenopathy.  Skin:    General: Skin is warm and dry.  Neurological:     General: No focal deficit present.     Mental Status: He is alert and oriented to person, place, and time.  Psychiatric:        Mood and Affect: Mood normal.        Behavior: Behavior normal.      UC Treatments / Results  Labs (all labs ordered are listed, but only abnormal results are  displayed) Labs Reviewed  CULTURE, GROUP A STREP ([facility])  SARS CORONAVIRUS 2 (TAT 6-24 HRS)  POCT RAPID STREP A (OFFICE)    EKG   Radiology No results found.  Procedures Procedures (including critical care time)  Medications Ordered in UC Medications - No data to display  Initial Impression / Assessment and Plan / UC Course  I have reviewed the triage vital signs and the nursing notes.  Pertinent labs & imaging results that were available during my care of the patient were reviewed by me and considered in my medical decision making (see chart for details).     Rapid strep negative, will culture.  Will also send COVID PCR and contact if positive Discussed likely allergy component.  Advised to try to consistently take his Flonase and allergy medication daily for at least the next 7 days PCP follow-up if symptoms do not improve ER precautions reviewed and patient verbalized understanding Final Clinical Impressions(s) / UC Diagnoses   Final diagnoses:  Sore throat  Environmental allergies  Acute pharyngitis, unspecified etiology     Discharge Instructions      Your strep test was negative.  The clinic will send this for culture and also send your COVID swab to the culture and contact you if either of these are positive Continue taking your Flonase and allergy medication daily Follow-up with your PCP if your symptoms do not improve Please go to the ER for any worsening symptoms     ED Prescriptions   None    PDMP not reviewed this encounter.   Radford Pax, NP 09/07/22 [phone]

## 2022-09-07 NOTE — Discharge Instructions (Signed)
Your strep test was negative.  The clinic will send this for culture and also send your COVID swab to the culture and contact you if either of these are positive Continue taking your Flonase and allergy medication daily Follow-up with your PCP if your symptoms do not improve Please go to the ER for any worsening symptoms

## 2022-09-08 LAB — CULTURE, GROUP A STREP (THRC)

## 2022-09-08 LAB — SARS CORONAVIRUS 2 (TAT 6-24 HRS): SARS Coronavirus 2: NEGATIVE

## 2022-09-10 LAB — CULTURE, GROUP A STREP (THRC)

## 2022-09-24 ENCOUNTER — Other Ambulatory Visit: Payer: Self-pay

## 2022-09-24 ENCOUNTER — Ambulatory Visit (INDEPENDENT_AMBULATORY_CARE_PROVIDER_SITE_OTHER): Payer: BC Managed Care – PPO | Admitting: Pharmacist

## 2022-09-24 ENCOUNTER — Other Ambulatory Visit (HOSPITAL_COMMUNITY)
Admission: RE | Admit: 2022-09-24 | Discharge: 2022-09-24 | Disposition: A | Discharge status: Home / Self Care | Payer: BC Managed Care – PPO | Source: Ambulatory Visit | Attending: Infectious Disease | Admitting: Infectious Disease

## 2022-09-24 DIAGNOSIS — Z2981 Encounter for HIV pre-exposure prophylaxis: Secondary | ICD-10-CM | POA: Diagnosis not present

## 2022-09-24 DIAGNOSIS — Z113 Encounter for screening for infections with a predominantly sexual mode of transmission: Secondary | ICD-10-CM | POA: Diagnosis present

## 2022-09-24 DIAGNOSIS — Z79899 Other long term (current) drug therapy: Secondary | ICD-10-CM

## 2022-09-24 MED ORDER — CABOTEGRAVIR ER 600 MG/3ML IM SUER
600.0000 mg | Freq: Once | INTRAMUSCULAR | Status: AC
Start: 2022-09-24 — End: 2022-09-24
  Administered 2022-09-24: 600 mg via INTRAMUSCULAR

## 2022-09-24 NOTE — Progress Notes (Signed)
HPI: Glenn Rivera is a 42 y.o. male who presents to the [facility] clinic for Apretude administration and HIV PrEP follow up.  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  Modified Medications   No medications on file  Discontinued Medications   No medications on file    Allergies: Allergies  Allergen Reactions   Lactose Intolerance (Gi) Diarrhea and Other (See Comments)    Diarrhea, upset stomach, and migraine    Past Medical History: Past Medical History:  Diagnosis Date   Depression 03/25/2010   History   Eczema 2010   intermittent / seasonal on soles of feet bilaterally   Examination of participant or control in clinical research 02/12/2016    Social History: Social History   Socioeconomic History   Marital status: Single    Spouse name: Not on file   Number of children: Not on file   Years of education: Not on file   Highest education level: Not on file  Occupational History   Not on file  Tobacco Use   Smoking status: Never   Smokeless tobacco: Never  Substance and Sexual Activity   Alcohol use: Yes    Alcohol/week: 2.0 standard drinks of alcohol    Types: 2 Shots of liquor per week    Comment: 1x/month   Drug use: No   Sexual activity: Yes    Comment: Pansexual  Other Topics Concern   Not on file  Social History Narrative   Not on file   Social Determinants of Health   Financial Resource Strain: Not on file  Food Insecurity: Not on file  Transportation Needs: Not on file  Physical Activity: Not on file  Stress: Not  on file  Social Connections: Not on file    Labs: Lab Results  Component Value Date   HIV1RNAQUANT Not Detected 07/23/2022   HIV1RNAQUANT Not Detected 05/28/2022   HIV1RNAQUANT Not Detected 03/29/2022    RPR and STI Lab Results  Component Value Date   LABRPR NON-REACTIVE 01/22/2022   LABRPR NON-REACTIVE 09/26/2021   LABRPR NON-REACTIVE 03/28/2021   LABRPR NON-REACTIVE 09/26/2020   LABRPR NON-REACTIVE 04/12/2020    STI Results GC GC CT CT  07/23/2022 10:02 AM Negative    Negative   Negative    Negative    06/06/2022  3:57 PM Negative   Negative    05/28/2022  9:14 AM Positive   Negative    05/28/2022  9:06 AM Negative   Negative    03/29/2022 10:10 AM Negative   Negative    01/22/2022  9:31 AM Negative    Negative   Negative    Negative    12/04/2021  9:49 AM Negative   Negative    09/26/2021  9:58 AM Negative   Negative    07/25/2021 10:43 AM Negative    Negative   Negative    Negative    07/25/2021  9:26 AM Negative   Negative    03/28/2021 10:16 AM Negative   Negative    03/28/2021  9:56 AM Negative   Negative  01/31/2021  9:38 AM Negative   Negative    11/29/2020 10:34 AM Negative   Negative    11/29/2020 10:08 AM Negative    Negative   Negative    Negative    03/27/2018 12:00 AM **POSITIVE**   Negative    10/02/2016  8:10 AM  NOT DETECTED   NOT DETECTED   02/12/2016  9:00 AM  NOT DETECTED   NOT DETECTED     Hepatitis B Lab Results  Component Value Date   HEPBSAB REACTIVE (A) 05/28/2022   HEPBSAG NEGATIVE 02/01/2016   HEPBCAB NON REACTIVE 02/12/2016   Hepatitis C Lab Results  Component Value Date   HEPCAB NON-REACTIVE 09/26/2020   Hepatitis A Lab Results  Component Value Date   HAV NON-REACTIVE 09/26/2021   Lipids: Lab Results  Component Value Date   CHOL 133 02/09/2018   TRIG 46 02/09/2018   HDL 60 02/09/2018   CHOLHDL 2.2 02/09/2018   VLDL 8 02/12/2016   LDLCALC 60 02/09/2018    TARGET DATE: The 7th of the month  Current PrEP  Regimen: Apretude  Assessment: Anquan presents today for their Apretude injection and to follow up for HIV PrEP. No issues with past injections. No known exposures to any STIs and no signs or symptoms of any STIs today. Last STI screening was in April and was negative. Screened patient for acute HIV symptoms such as fatigue, muscle aches, rash, sore throat, lymphadenopathy, headache, night sweats, nausea/vomiting/diarrhea, and fever. Patient denies any symptoms. One new partner since last injection; will check RPR and urine/oral cytologies today.   Per [employer] guidelines, a rapid HIV test should be drawn prior to Apretude administration. Due to state shortage of rapid HIV tests, this is temporarily unable to be done. Per decision from [facility] physicians, we will proceed with Apretude administration at this time without a negative rapid HIV test beforehand. HIV RNA was collected today and is in process.  Administered cabotegravir 600mg /50mL in left upper outer quadrant of the gluteal muscle. Will make follow up appointments for maintenance injections every 2 months.   Plan: - Maintenance injections scheduled for 9/3 with me  - Check HIV RNA, RPR, and urine/oral cytologies  - Call with any issues or questions  Margarite Gouge, PharmD, CPP, BCIDP, AAHIVP Clinical Pharmacist Practitioner Infectious Diseases Clinical Pharmacist [facility]

## 2022-09-25 LAB — URINE CYTOLOGY ANCILLARY ONLY
Chlamydia: NEGATIVE
Comment: NEGATIVE
Comment: NORMAL
Neisseria Gonorrhea: NEGATIVE

## 2022-09-25 LAB — CYTOLOGY, (ORAL, ANAL, URETHRAL) ANCILLARY ONLY
Chlamydia: NEGATIVE
Comment: NEGATIVE
Comment: NORMAL
Neisseria Gonorrhea: NEGATIVE

## 2022-09-26 LAB — RPR: RPR Ser Ql: NONREACTIVE

## 2022-09-26 LAB — HIV-1 RNA QUANT-NO REFLEX-BLD
HIV 1 RNA Quant: NOT DETECTED Copies/mL
HIV-1 RNA Quant, Log: NOT DETECTED Log cps/mL

## 2022-11-26 ENCOUNTER — Ambulatory Visit: Payer: BC Managed Care – PPO | Admitting: Pharmacist

## 2022-12-04 ENCOUNTER — Other Ambulatory Visit: Payer: Self-pay

## 2022-12-04 ENCOUNTER — Other Ambulatory Visit (HOSPITAL_COMMUNITY)
Admission: RE | Admit: 2022-12-04 | Discharge: 2022-12-04 | Disposition: A | Discharge status: Home / Self Care | Payer: BC Managed Care – PPO | Source: Ambulatory Visit | Attending: Infectious Disease | Admitting: Infectious Disease

## 2022-12-04 ENCOUNTER — Ambulatory Visit (INDEPENDENT_AMBULATORY_CARE_PROVIDER_SITE_OTHER): Payer: BC Managed Care – PPO | Admitting: Pharmacist

## 2022-12-04 ENCOUNTER — Ambulatory Visit: Payer: BC Managed Care – PPO | Admitting: Pharmacist

## 2022-12-04 DIAGNOSIS — Z2981 Encounter for HIV pre-exposure prophylaxis: Secondary | ICD-10-CM

## 2022-12-04 DIAGNOSIS — Z113 Encounter for screening for infections with a predominantly sexual mode of transmission: Secondary | ICD-10-CM | POA: Insufficient documentation

## 2022-12-04 DIAGNOSIS — Z23 Encounter for immunization: Secondary | ICD-10-CM

## 2022-12-04 DIAGNOSIS — Z79899 Other long term (current) drug therapy: Secondary | ICD-10-CM

## 2022-12-04 MED ORDER — CABOTEGRAVIR ER 600 MG/3ML IM SUER
600.0000 mg | Freq: Once | INTRAMUSCULAR | Status: AC
Start: 2022-12-04 — End: 2022-12-04
  Administered 2022-12-04: 600 mg via INTRAMUSCULAR

## 2022-12-04 NOTE — Progress Notes (Signed)
HPI: Glenn Rivera is a 42 y.o. male who presents to the [facility] pharmacy clinic for Apretude administration and HIV PrEP follow up.  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  Modified Medications   No medications on file  Discontinued Medications   No medications on file    Allergies: Allergies  Allergen Reactions   Lactose Intolerance (Gi) Diarrhea and Other (See Comments)    Diarrhea, upset stomach, and migraine    Past Medical History: Past Medical History:  Diagnosis Date   Depression 03/25/2010   History   Eczema 2010   intermittent / seasonal on soles of feet bilaterally   Examination of participant or control in clinical research 02/12/2016    Social History: Social History   Socioeconomic History   Marital status: Single    Spouse name: Not on file   Number of children: Not on file   Years of education: Not on file   Highest education level: Not on file  Occupational History   Not on file  Tobacco Use   Smoking status: Never   Smokeless tobacco: Never  Substance and Sexual Activity   Alcohol use: Yes    Alcohol/week: 2.0 standard drinks of alcohol    Types: 2 Shots of liquor per week    Comment: 1x/month   Drug use: No   Sexual activity: Yes    Comment: Pansexual  Other Topics Concern   Not on file  Social History Narrative   Not on file   Social Determinants of Health   Financial Resource Strain: Not on file  Food Insecurity: Not on file  Transportation Needs: Not on file  Physical Activity: Not on file  Stress: Not  on file  Social Connections: Not on file    Labs: Lab Results  Component Value Date   HIV1RNAQUANT Not Detected 09/24/2022   HIV1RNAQUANT Not Detected 07/23/2022   HIV1RNAQUANT Not Detected 05/28/2022    RPR and STI Lab Results  Component Value Date   LABRPR NON-REACTIVE 09/24/2022   LABRPR NON-REACTIVE 01/22/2022   LABRPR NON-REACTIVE 09/26/2021   LABRPR NON-REACTIVE 03/28/2021   LABRPR NON-REACTIVE 09/26/2020    STI Results GC GC CT CT  09/24/2022  3:25 PM Negative    Negative   Negative    Negative    07/23/2022 10:02 AM Negative    Negative   Negative    Negative    06/06/2022  3:57 PM Negative   Negative    05/28/2022  9:14 AM Positive   Negative    05/28/2022  9:06 AM Negative   Negative    03/29/2022 10:10 AM Negative   Negative    01/22/2022  9:31 AM Negative    Negative   Negative    Negative    12/04/2021  9:49 AM Negative   Negative    09/26/2021  9:58 AM Negative   Negative    07/25/2021 10:43 AM Negative    Negative   Negative    Negative    07/25/2021  9:26 AM Negative   Negative  03/28/2021 10:16 AM Negative   Negative    03/28/2021  9:56 AM Negative   Negative    01/31/2021  9:38 AM Negative   Negative    11/29/2020 10:34 AM Negative   Negative    11/29/2020 10:08 AM Negative    Negative   Negative    Negative    03/27/2018 12:00 AM **POSITIVE**   Negative    10/02/2016  8:10 AM  NOT DETECTED   NOT DETECTED   02/12/2016  9:00 AM  NOT DETECTED   NOT DETECTED     Hepatitis B Lab Results  Component Value Date   HEPBSAB REACTIVE (A) 05/28/2022   HEPBSAG NEGATIVE 02/01/2016   HEPBCAB NON REACTIVE 02/12/2016   Hepatitis C Lab Results  Component Value Date   HEPCAB NON-REACTIVE 09/26/2020   Hepatitis A Lab Results  Component Value Date   HAV NON-REACTIVE 09/26/2021   Lipids: Lab Results  Component Value Date   CHOL 133 02/09/2018   TRIG 46 02/09/2018   HDL 60 02/09/2018   CHOLHDL 2.2 02/09/2018   VLDL 8 02/12/2016   LDLCALC 60 02/09/2018     TARGET DATE: The 7th of the month  Current PrEP Regimen: Apretude  Assessment: Glenn Rivera presents today for their Apretude injection and to follow up for HIV PrEP. No issues with past injections. No known exposures to any STIs and no signs or symptoms of any STIs today. Screened patient for acute HIV symptoms such as fatigue, muscle aches, rash, sore throat, lymphadenopathy, headache, night sweats, nausea/vomiting/diarrhea, and fever. Patient denies any symptoms. Has been active with a couple new partners since last injection; will check urine/oral cytologies today.   Per [employer] guidelines, a rapid HIV test should be drawn prior to Apretude administration. Due to state shortage of rapid HIV tests, this is temporarily unable to be done. Per decision from [facility] physicians, we will proceed with Apretude administration at this time without a negative rapid HIV test beforehand. HIV RNA was collected today and is in process.  Administered cabotegravir 600mg /24mL in right upper outer quadrant of the gluteal muscle. Will make follow up appointments for maintenance injections every 2 months.   Due for annual flu and updated COVID vaccinations which were both administered today.   Plan: - Maintenance injections scheduled for 11/6 with me  - Check HIV RNA and urine/oral cytologies - Administer flu and COVID vaccinations  - Call with any issues or questions  Glenn Rivera, PharmD, CPP, BCIDP, AAHIVP Clinical Pharmacist Practitioner Infectious Diseases Clinical Pharmacist [facility]

## 2022-12-05 LAB — URINE CYTOLOGY ANCILLARY ONLY
Chlamydia: NEGATIVE
Comment: NEGATIVE
Comment: NORMAL
Neisseria Gonorrhea: NEGATIVE

## 2022-12-05 LAB — CYTOLOGY, (ORAL, ANAL, URETHRAL) ANCILLARY ONLY
Chlamydia: NEGATIVE
Comment: NEGATIVE
Comment: NORMAL
Neisseria Gonorrhea: NEGATIVE

## 2022-12-06 LAB — HIV-1 RNA QUANT-NO REFLEX-BLD
HIV 1 RNA Quant: NOT DETECTED {copies}/mL
HIV-1 RNA Quant, Log: NOT DETECTED {Log_copies}/mL

## 2023-01-28 NOTE — Progress Notes (Unsigned)
HPI: Glenn Rivera is a 42 y.o. male who presents to the [facility] pharmacy clinic for Apretude administration and HIV PrEP follow up.  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  Modified Medications   No medications on file  Discontinued Medications   No medications on file    Allergies: Allergies  Allergen Reactions   Lactose Intolerance (Gi) Diarrhea and Other (See Comments)    Diarrhea, upset stomach, and migraine    Labs: Lab Results  Component Value Date   HIV1RNAQUANT Not Detected 12/04/2022   HIV1RNAQUANT Not Detected 09/24/2022   HIV1RNAQUANT Not Detected 07/23/2022    RPR and STI Lab Results  Component Value Date   LABRPR NON-REACTIVE 09/24/2022   LABRPR NON-REACTIVE 01/22/2022   LABRPR NON-REACTIVE 09/26/2021   LABRPR NON-REACTIVE 03/28/2021   LABRPR NON-REACTIVE 09/26/2020    STI Results GC GC CT CT  12/04/2022 10:53 AM Negative   Negative    12/04/2022 10:21 AM Negative   Negative    09/24/2022  3:25 PM Negative    Negative   Negative    Negative    07/23/2022 10:02 AM Negative    Negative   Negative    Negative    06/06/2022  3:57 PM Negative   Negative    05/28/2022  9:14 AM Positive   Negative    05/28/2022  9:06 AM Negative   Negative    03/29/2022 10:10 AM Negative   Negative    01/22/2022  9:31 AM Negative    Negative   Negative    Negative    12/04/2021  9:49 AM Negative   Negative    09/26/2021  9:58 AM Negative   Negative    07/25/2021 10:43 AM Negative    Negative   Negative     Negative    07/25/2021  9:26 AM Negative   Negative    03/28/2021 10:16 AM Negative   Negative    03/28/2021  9:56 AM Negative   Negative    01/31/2021  9:38 AM Negative   Negative    11/29/2020 10:34 AM Negative   Negative    11/29/2020 10:08 AM Negative    Negative   Negative    Negative    03/27/2018 12:00 AM **POSITIVE**   Negative    10/02/2016  8:10 AM  NOT DETECTED   NOT DETECTED     Hepatitis B Lab Results  Component Value Date   HEPBSAB REACTIVE (A) 05/28/2022   HEPBSAG NEGATIVE 02/01/2016   HEPBCAB NON REACTIVE 02/12/2016   Hepatitis C Lab Results  Component Value Date   HEPCAB NON-REACTIVE 09/26/2020   Hepatitis A Lab Results  Component Value Date   HAV NON-REACTIVE 09/26/2021   Lipids: Lab Results  Component Value Date   CHOL 133 02/09/2018   TRIG 46 02/09/2018   HDL 60 02/09/2018   CHOLHDL 2.2 02/09/2018   VLDL 8 02/12/2016   LDLCALC 60 02/09/2018    TARGET DATE: The 7th of the month  Assessment: Glenn Rivera  presents today for his Apretude injection and to follow up for HIV PrEP. No issues with past injections. Denies any symptoms of acute HIV. Last STI screening was on 12/04/22 and was negative. No known exposures to any STIs since last visit. Glenn Rivera agrees to full STI testing today with RPR and oral/urine/rectal cytologies.   Immunizations: We will get HAV confirmation antibody today.  Per [employer] guidelines, a rapid HIV test should be drawn prior to Apretude administration. Due to state shortage of rapid HIV tests, this is temporarily unable to be done. Per decision from [facility] physicians, we will proceed with Apretude administration at this time without a negative rapid HIV test beforehand. HIV RNA was collected today and is in process.  Administered cabotegravir 600mg /64mL in left upper outer quadrant of the gluteal muscle. Will see Glenn Rivera back in 2 months for injection, labs, and HIV PrEP follow up.  Plan: - Apretude injection administered - HIV RNA,  HAV antibody today - STI testing: RPR, oral/urine cytologies for gonorrhea/chlamydia - Next injection, labs, and PrEP follow up appointment scheduled for 04/01/23 with Marchelle Folks - Call with any issues or questions  Lora Paula, PharmD PGY-2 Infectious Diseases Pharmacy Resident [facility] 01/29/2023 11:22 AM

## 2023-01-29 ENCOUNTER — Other Ambulatory Visit (HOSPITAL_COMMUNITY)
Admission: RE | Admit: 2023-01-29 | Discharge: 2023-01-29 | Disposition: A | Discharge status: Home / Self Care | Payer: BC Managed Care – PPO | Source: Ambulatory Visit | Attending: Infectious Disease | Admitting: Infectious Disease

## 2023-01-29 ENCOUNTER — Other Ambulatory Visit: Payer: Self-pay

## 2023-01-29 ENCOUNTER — Ambulatory Visit: Payer: BC Managed Care – PPO | Admitting: Pharmacist

## 2023-01-29 DIAGNOSIS — Z113 Encounter for screening for infections with a predominantly sexual mode of transmission: Secondary | ICD-10-CM | POA: Insufficient documentation

## 2023-01-29 DIAGNOSIS — Z2981 Encounter for HIV pre-exposure prophylaxis: Secondary | ICD-10-CM

## 2023-01-29 MED ORDER — CABOTEGRAVIR ER 600 MG/3ML IM SUER
600.0000 mg | Freq: Once | INTRAMUSCULAR | Status: AC
Start: 2023-01-29 — End: 2023-01-29
  Administered 2023-01-29: 600 mg via INTRAMUSCULAR

## 2023-01-30 LAB — URINE CYTOLOGY ANCILLARY ONLY
Chlamydia: NEGATIVE
Comment: NEGATIVE
Comment: NORMAL
Neisseria Gonorrhea: NEGATIVE

## 2023-01-30 LAB — CYTOLOGY, (ORAL, ANAL, URETHRAL) ANCILLARY ONLY
Chlamydia: NEGATIVE
Comment: NEGATIVE
Comment: NORMAL
Neisseria Gonorrhea: NEGATIVE

## 2023-02-01 LAB — HEPATITIS A ANTIBODY, TOTAL: Hepatitis A AB,Total: REACTIVE — AB

## 2023-02-01 LAB — HIV-1 RNA QUANT-NO REFLEX-BLD
HIV 1 RNA Quant: NOT DETECTED {copies}/mL
HIV-1 RNA Quant, Log: NOT DETECTED {Log_copies}/mL

## 2023-02-04 ENCOUNTER — Other Ambulatory Visit: Payer: Self-pay

## 2023-02-04 ENCOUNTER — Emergency Department (HOSPITAL_COMMUNITY): Payer: BC Managed Care – PPO

## 2023-02-04 ENCOUNTER — Emergency Department (HOSPITAL_COMMUNITY)
Admission: EM | Admit: 2023-02-04 | Discharge: 2023-02-04 | Disposition: A | Discharge status: Home / Self Care | Payer: BC Managed Care – PPO | Attending: Emergency Medicine | Admitting: Emergency Medicine

## 2023-02-04 DIAGNOSIS — S0990XA Unspecified injury of head, initial encounter: Secondary | ICD-10-CM | POA: Diagnosis present

## 2023-02-04 DIAGNOSIS — S060X0A Concussion without loss of consciousness, initial encounter: Secondary | ICD-10-CM | POA: Insufficient documentation

## 2023-02-04 DIAGNOSIS — Y92410 Unspecified street and highway as the place of occurrence of the external cause: Secondary | ICD-10-CM | POA: Insufficient documentation

## 2023-02-04 DIAGNOSIS — R42 Dizziness and giddiness: Secondary | ICD-10-CM

## 2023-02-04 LAB — I-STAT CHEM 8, ED
BUN: 15 mg/dL (ref 6–20)
Calcium, Ion: 1.1 mmol/L — ABNORMAL LOW (ref 1.15–1.40)
Chloride: 103 mmol/L (ref 98–111)
Creatinine, Ser: 1.3 mg/dL — ABNORMAL HIGH (ref 0.61–1.24)
Glucose, Bld: 90 mg/dL (ref 70–99)
HCT: 45 % (ref 39.0–52.0)
Hemoglobin: 15.3 g/dL (ref 13.0–17.0)
Potassium: 3.8 mmol/L (ref 3.5–5.1)
Sodium: 137 mmol/L (ref 135–145)
TCO2: 25 mmol/L (ref 22–32)

## 2023-02-04 MED ORDER — MECLIZINE HCL 25 MG PO TABS: 25.0000 mg | ORAL_TABLET | Freq: Three times a day (TID) | ORAL | 0 refills | Status: DC | PRN

## 2023-02-04 MED ORDER — IOPAMIDOL (ISOVUE-370) INJECTION 76%
75.0000 mL | Freq: Once | INTRAVENOUS | Status: AC | PRN
  Administered 2023-02-04: 75 mL via INTRAVENOUS

## 2023-02-04 MED ORDER — MECLIZINE HCL 25 MG PO TABS
25.0000 mg | ORAL_TABLET | Freq: Once | ORAL | Status: AC
  Administered 2023-02-04: 25 mg via ORAL
  Filled 2023-02-04: qty 1

## 2023-02-04 MED ORDER — NAPROXEN 375 MG PO TABS: 375.0000 mg | ORAL_TABLET | Freq: Two times a day (BID) | ORAL | 0 refills | Status: DC

## 2023-02-04 NOTE — Discharge Instructions (Addendum)
Contact a health care provider if: Your symptoms do not improve or get worse. You have new symptoms. You have another injury. Your coordination gets worse. You have unusual behavior changes. Get help right away if: You have a severe or worsening headache. You have weakness or numbness in any part of your body, slurred speech, vision changes, or confusion. You vomit repeatedly. You lose consciousness, are sleepier than normal, or are difficult to wake up. You have a seizure. These symptoms may be an emergency. Get help right away. Call 911. Do not wait to see if the symptoms will go away. Do not drive yourself to the hospital.
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
## 2023-03-31 NOTE — Progress Notes (Signed)
 HPI: Glenn Rivera is a 43 y.o. male who presents to the [facility] pharmacy clinic for Apretude  administration and HIV PrEP follow up.  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   CHOLECALCIFEROL (VITAMIN D-3 PO)    Take 1 tablet by mouth daily.   CYANOCOBALAMIN (VITAMIN B-12 PO)    Take 1 tablet by mouth daily.   MECLIZINE  (ANTIVERT ) 25 MG TABLET    Take 1 tablet (25 mg total) by mouth 3 (three) times daily as needed for dizziness.   MULTIPLE VITAMINS-MINERALS (MULTIVITAMIN MEN) TABS    Take 1 tablet by mouth daily.   NAPROXEN  (NAPROSYN ) 375 MG TABLET    Take 1 tablet (375 mg total) by mouth 2 (two) times daily with a meal.  Modified Medications   No medications on file  Discontinued Medications   No medications on file    Allergies: Allergies  Allergen Reactions   Milk Protein Nausea And Vomiting and Other (See Comments)    Dehydration Migraines Extreme nausea, vomiting Constipation     Labs: Lab Results  Component Value Date   HIV1RNAQUANT Not Detected 01/29/2023   HIV1RNAQUANT Not Detected 12/04/2022   HIV1RNAQUANT Not Detected 09/24/2022    RPR and STI Lab Results  Component Value Date   LABRPR NON-REACTIVE 09/24/2022   LABRPR NON-REACTIVE 01/22/2022   LABRPR NON-REACTIVE 09/26/2021   LABRPR NON-REACTIVE 03/28/2021   LABRPR NON-REACTIVE 09/26/2020    STI Results GC CT  01/29/2023 10:17 AM Negative    Negative  Negative    Negative   12/04/2022 10:53 AM Negative  Negative   12/04/2022 10:21 AM Negative  Negative   09/24/2022  3:25 PM Negative    Negative  Negative     Negative   07/23/2022 10:02 AM Negative    Negative  Negative    Negative   06/06/2022  3:57 PM Negative  Negative   05/28/2022  9:14 AM Positive  Negative   05/28/2022  9:06 AM Negative  Negative   03/29/2022 10:10 AM Negative  Negative   01/22/2022  9:31 AM Negative    Negative  Negative    Negative   12/04/2021  9:49 AM Negative  Negative   09/26/2021  9:58 AM Negative  Negative   07/25/2021 10:43 AM Negative    Negative  Negative    Negative   07/25/2021  9:26 AM Negative  Negative   03/28/2021 10:16 AM Negative  Negative   03/28/2021  9:56 AM Negative  Negative   01/31/2021  9:38 AM Negative  Negative   11/29/2020 10:34 AM Negative  Negative   11/29/2020 10:08 AM Negative    Negative  Negative    Negative   03/27/2018 12:00 AM **POSITIVE**  Negative     Hepatitis B Lab Results  Component Value Date   HEPBSAB REACTIVE (A) 05/28/2022   HEPBSAG NEGATIVE 02/01/2016   HEPBCAB NON REACTIVE 02/12/2016   Hepatitis C Lab Results  Component Value Date   HEPCAB NON-REACTIVE 09/26/2020   Hepatitis A Lab Results  Component Value Date   HAV REACTIVE (A)  01/29/2023   Lipids: Lab Results  Component Value Date   CHOL 133 02/09/2018   TRIG 46 02/09/2018   HDL 60 02/09/2018   CHOLHDL 2.2 02/09/2018   VLDL 8 02/12/2016   LDLCALC 60 02/09/2018    TARGET DATE: The 7th of the month  Assessment: Glenn Rivera presents today for his Apretude  injection and to follow up for HIV PrEP. No issues with past injections. Denies any symptoms of acute HIV. Last STI screening was on 01/2023 and was negative. No known exposures to any STIs since last visit. Glenn Rivera agrees to full STI testing today with RPR and oral/urine/rectal cytologies. Glenn Rivera requests DoxyPEP today. He is a great candidate for this. He was very well informed about dosing. Discussed dose of 2 tablets (200 mg) by mouth 24-72 hours prior to sexual contact. Will send this to his [facility].  Immunizations: Up to date  today.  Per [employer] guidelines, a rapid HIV test should be drawn prior to Apretude  administration. Due to state shortage of rapid HIV tests, this is temporarily unable to be done. Per decision from [facility] physicians, we will proceed with Apretude  administration at this time without a negative rapid HIV test beforehand. HIV RNA was collected today and is in process.  Pharmacy student, Glenn Rivera administered cabotegravir  600mg /54mL in right upper outer quadrant of the gluteal muscle under the supervision of Alan Geralds. Will see Glenn Rivera back in 2 months for injection, labs, and HIV PrEP follow up.  Plan: - Apretude  injection administered - HIV RNA today - STI testing: RPR, oral/urine cytologies for chlamydia/gonorrhea - Doxycycline  PEP sent to [facility] - Next injection, labs, and PrEP follow up appointment scheduled for 05/27/23 with Alan - Call with any issues or questions  Con Laughter, PharmD PGY-2 Infectious Diseases Pharmacy Resident [facility]

## 2023-04-01 ENCOUNTER — Other Ambulatory Visit (HOSPITAL_COMMUNITY)
Admission: RE | Admit: 2023-04-01 | Discharge: 2023-04-01 | Disposition: A | Discharge status: Home / Self Care | Payer: BC Managed Care – PPO | Source: Ambulatory Visit | Attending: Infectious Disease | Admitting: Infectious Disease

## 2023-04-01 ENCOUNTER — Other Ambulatory Visit: Payer: Self-pay

## 2023-04-01 ENCOUNTER — Ambulatory Visit: Payer: BC Managed Care – PPO | Admitting: Pharmacist

## 2023-04-01 DIAGNOSIS — Z113 Encounter for screening for infections with a predominantly sexual mode of transmission: Secondary | ICD-10-CM | POA: Diagnosis present

## 2023-04-01 DIAGNOSIS — Z202 Contact with and (suspected) exposure to infections with a predominantly sexual mode of transmission: Secondary | ICD-10-CM

## 2023-04-01 DIAGNOSIS — Z2981 Encounter for HIV pre-exposure prophylaxis: Secondary | ICD-10-CM | POA: Diagnosis not present

## 2023-04-01 MED ORDER — DOXYCYCLINE HYCLATE 100 MG PO TABS
ORAL_TABLET | ORAL | 0 refills | Status: DC
Start: 2023-04-01 — End: 2023-05-27

## 2023-04-01 MED ORDER — CABOTEGRAVIR ER 600 MG/3ML IM SUER
600.0000 mg | Freq: Once | INTRAMUSCULAR | Status: AC
Start: 2023-04-01 — End: 2023-04-01
  Administered 2023-04-01: 600 mg via INTRAMUSCULAR

## 2023-04-02 LAB — URINE CYTOLOGY ANCILLARY ONLY
Chlamydia: NEGATIVE
Comment: NEGATIVE
Comment: NORMAL
Neisseria Gonorrhea: NEGATIVE

## 2023-04-02 LAB — CYTOLOGY, (ORAL, ANAL, URETHRAL) ANCILLARY ONLY
Chlamydia: NEGATIVE
Comment: NEGATIVE
Comment: NORMAL
Neisseria Gonorrhea: NEGATIVE

## 2023-04-03 LAB — RPR: RPR Ser Ql: NONREACTIVE

## 2023-04-03 LAB — HIV-1 RNA QUANT-NO REFLEX-BLD
HIV 1 RNA Quant: NOT DETECTED {copies}/mL
HIV-1 RNA Quant, Log: NOT DETECTED {Log_copies}/mL

## 2023-05-07 ENCOUNTER — Other Ambulatory Visit (HOSPITAL_COMMUNITY): Payer: Self-pay

## 2023-05-25 ENCOUNTER — Encounter: Payer: Self-pay | Admitting: *Deleted

## 2023-05-25 ENCOUNTER — Ambulatory Visit
Admission: EM | Admit: 2023-05-25 | Discharge: 2023-05-25 | Disposition: A | Discharge status: Home / Self Care | Attending: Family Medicine | Admitting: Family Medicine

## 2023-05-25 DIAGNOSIS — N342 Other urethritis: Secondary | ICD-10-CM | POA: Insufficient documentation

## 2023-05-25 DIAGNOSIS — Z202 Contact with and (suspected) exposure to infections with a predominantly sexual mode of transmission: Secondary | ICD-10-CM | POA: Diagnosis present

## 2023-05-25 LAB — POCT URINALYSIS DIP (MANUAL ENTRY)
Bilirubin, UA: NEGATIVE
Blood, UA: NEGATIVE
Glucose, UA: NEGATIVE mg/dL
Ketones, POC UA: NEGATIVE mg/dL
Nitrite, UA: NEGATIVE
Protein Ur, POC: NEGATIVE mg/dL
Spec Grav, UA: 1.01 (ref 1.010–1.025)
Urobilinogen, UA: 0.2 U/dL
pH, UA: 5.5 (ref 5.0–8.0)

## 2023-05-25 MED ORDER — CEFTRIAXONE SODIUM 500 MG IJ SOLR
500.0000 mg | INTRAMUSCULAR | Status: DC
  Administered 2023-05-25: 500 mg via INTRAMUSCULAR

## 2023-05-25 NOTE — Discharge Instructions (Addendum)
 He had received an injection of ceftriaxone to treat your infection of gonorrhea today.  Avoid all forms of sexual intercourse (oral, vaginal, anal) for the next 7 days to avoid spreading/reinfecting or at least until we can see what kinds of infection results are positive.  Abstaining for 2 weeks would be better but at least 1 week is required.  We will let you know about your test results from the swab we did today and if you need any prescriptions for antibiotics or changes to your treatment from today.
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
## 2023-05-25 NOTE — ED Triage Notes (Signed)
 Patient states exposure to Gonorrhea, having penile discharge and dysuria, some lower abdominal discomfort.

## 2023-05-26 LAB — CYTOLOGY, (ORAL, ANAL, URETHRAL) ANCILLARY ONLY
Chlamydia: NEGATIVE
Comment: NEGATIVE
Comment: NEGATIVE
Comment: NORMAL
Neisseria Gonorrhea: POSITIVE — AB
Trichomonas: NEGATIVE

## 2023-05-26 NOTE — Progress Notes (Unsigned)
 HPI: Glenn Rivera is a 43 y.o. male who presents to the [facility] pharmacy clinic for Apretude administration and HIV PrEP follow up.  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   CHOLECALCIFEROL (VITAMIN D-3 PO)    Take 1 tablet by mouth daily.   CYANOCOBALAMIN (VITAMIN B-12 PO)    Take 1 tablet by mouth daily.   DOXYCYCLINE (VIBRA-TABS) 100 MG TABLET    Take 200 mg (2 tablets) by mouth 24-72 hours after sexual encounter. Call office for refills as needed.   MECLIZINE (ANTIVERT) 25 MG TABLET    Take 1 tablet (25 mg total) by mouth 3 (three) times daily as needed for dizziness.   MULTIPLE VITAMINS-MINERALS (MULTIVITAMIN MEN) TABS    Take 1 tablet by mouth daily.   NAPROXEN (NAPROSYN) 375 MG TABLET    Take 1 tablet (375 mg total) by mouth 2 (two) times daily with a meal.  Modified Medications   No medications on file  Discontinued Medications   No medications on file    Allergies: Allergies  Allergen Reactions   Casein Nausea And Vomiting and Other (See Comments)    Dehydration Migraines Extreme nausea, vomiting Constipation   Milk Protein Nausea And Vomiting and Other (See Comments)    Dehydration Migraines Extreme nausea, vomiting Constipation     Past Medical History: Past Medical History:  Diagnosis Date   Depression 03/25/2010   History   Eczema 2010   intermittent / seasonal on soles of feet bilaterally   Examination of participant or control in clinical research 02/12/2016    Social History: Social History   Socioeconomic History   Marital status: Single    Spouse name: Not on file   Number of children: Not on  file   Years of education: Not on file   Highest education level: Not on file  Occupational History   Not on file  Tobacco Use   Smoking status: Never   Smokeless tobacco: Never  Vaping Use   Vaping status: Never Used  Substance and Sexual Activity   Alcohol use: Yes    Alcohol/week: 2.0 standard drinks of alcohol    Types: 2 Shots of liquor per week    Comment: 1x/month   Drug use: No   Sexual activity: Yes    Comment: Pansexual  Other Topics Concern   Not on file  Social History Narrative   Not on file   Social Drivers of Health   Financial Resource Strain: Not on file  Food Insecurity: Low Risk  (04/08/2023)   Received from [facility]   Hunger Vital Sign    Worried About Running Out of Food in the Last Year: Never true    Ran Out of Food in the Last Year: Never true  Transportation Needs: No Transportation Needs (04/08/2023)   Received from [facility]    In the past 12 months, has lack of reliable transportation kept you from medical appointments, meetings, work or from getting things needed for daily living? : No  Physical Activity: Not on file  Stress: Not on file  Social Connections: Not on file  Labs: Lab Results  Component Value Date   HIV1RNAQUANT Not Detected 04/01/2023   HIV1RNAQUANT Not Detected 01/29/2023   HIV1RNAQUANT Not Detected 12/04/2022    RPR and STI Lab Results  Component Value Date   LABRPR NON-REACTIVE 04/01/2023   LABRPR NON-REACTIVE 09/24/2022   LABRPR NON-REACTIVE 01/22/2022   LABRPR NON-REACTIVE 09/26/2021   LABRPR NON-REACTIVE 03/28/2021    STI Results GC CT  04/01/2023  3:33 PM Negative    Negative  Negative    Negative   01/29/2023 10:17 AM Negative    Negative  Negative    Negative   12/04/2022 10:53 AM Negative  Negative   12/04/2022 10:21 AM Negative  Negative   09/24/2022  3:25 PM Negative    Negative  Negative    Negative   07/23/2022 10:02 AM Negative    Negative  Negative    Negative    06/06/2022  3:57 PM Negative  Negative   05/28/2022  9:14 AM Positive  Negative   05/28/2022  9:06 AM Negative  Negative   03/29/2022 10:10 AM Negative  Negative   01/22/2022  9:31 AM Negative    Negative  Negative    Negative   12/04/2021  9:49 AM Negative  Negative   09/26/2021  9:58 AM Negative  Negative   07/25/2021 10:43 AM Negative    Negative  Negative    Negative   07/25/2021  9:26 AM Negative  Negative   03/28/2021 10:16 AM Negative  Negative   03/28/2021  9:56 AM Negative  Negative   01/31/2021  9:38 AM Negative  Negative   11/29/2020 10:34 AM Negative  Negative   11/29/2020 10:08 AM Negative    Negative  Negative    Negative     Hepatitis B Lab Results  Component Value Date   HEPBSAB REACTIVE (A) 05/28/2022   HEPBSAG NEGATIVE 02/01/2016   HEPBCAB NON REACTIVE 02/12/2016   Hepatitis C Lab Results  Component Value Date   HEPCAB NON-REACTIVE 09/26/2020   Hepatitis A Lab Results  Component Value Date   HAV REACTIVE (A) 01/29/2023   Lipids: Lab Results  Component Value Date   CHOL 133 02/09/2018   TRIG 46 02/09/2018   HDL 60 02/09/2018   CHOLHDL 2.2 02/09/2018   VLDL 8 02/12/2016   LDLCALC 60 02/09/2018    TARGET DATE: The 7th of the month  Assessment: Travontae presents today for their Apretude injection and to follow up for HIV PrEP. No issues with past injections.  Screened patient for acute HIV symptoms such as fatigue, muscle aches, rash, sore throat, lymphadenopathy, headache, night sweats, nausea/vomiting/diarrhea, and fever. Patient denies any symptoms.   Per [employer] guidelines, a rapid HIV test should be drawn prior to Apretude administration. Due to state shortage of rapid HIV tests, this is temporarily unable to be done. Per decision from [facility] physicians, we will proceed with Apretude administration at this time without a negative rapid HIV test beforehand. HIV RNA was collected today and is in process.  Administered cabotegravir 600mg /24mL in left  upper outer quadrant of the gluteal muscle. Will make follow up appointments for maintenance injections every 2 months.   States he was active with a new partner last week and forgot to take doxyPEP within 72 hours. Visited urgent care on 3/2 for symptoms consistent with gonorrhea; received ceftriaxone 500 mg x 1 with results now positive for gonorrhea. Still requests oral cytologies today. Reviewed instructions for doxyPEP and also sent more refills in to [facility] for  future use. Up-to-date on vaccines.   Plan: - Administer Apretude 600 mg x 1  - Maintenance injections scheduled for 5/1 and 7/1 with me  - Check HIV RNA and oral cytologies - Refill doxyPEP  - Call with any issues or questions  Margarite Gouge, PharmD, CPP, BCIDP, AAHIVP Clinical Pharmacist Practitioner Infectious Diseases Clinical Pharmacist [facility]

## 2023-05-27 ENCOUNTER — Other Ambulatory Visit (HOSPITAL_COMMUNITY)
Admission: RE | Admit: 2023-05-27 | Discharge: 2023-05-27 | Disposition: A | Discharge status: Home / Self Care | Source: Ambulatory Visit | Attending: Infectious Disease | Admitting: Infectious Disease

## 2023-05-27 ENCOUNTER — Other Ambulatory Visit: Payer: Self-pay

## 2023-05-27 ENCOUNTER — Ambulatory Visit: Payer: BC Managed Care – PPO | Admitting: Pharmacist

## 2023-05-27 DIAGNOSIS — Z113 Encounter for screening for infections with a predominantly sexual mode of transmission: Secondary | ICD-10-CM | POA: Diagnosis present

## 2023-05-27 DIAGNOSIS — Z202 Contact with and (suspected) exposure to infections with a predominantly sexual mode of transmission: Secondary | ICD-10-CM

## 2023-05-27 DIAGNOSIS — Z2981 Encounter for HIV pre-exposure prophylaxis: Secondary | ICD-10-CM

## 2023-05-27 MED ORDER — DOXYCYCLINE HYCLATE 100 MG PO TABS
ORAL_TABLET | ORAL | 1 refills | Status: AC
Start: 2023-05-27 — End: ?

## 2023-05-27 MED ORDER — CABOTEGRAVIR ER 600 MG/3ML IM SUER
600.0000 mg | Freq: Once | INTRAMUSCULAR | Status: AC
  Administered 2023-05-27: 600 mg via INTRAMUSCULAR

## 2023-05-28 LAB — CYTOLOGY, (ORAL, ANAL, URETHRAL) ANCILLARY ONLY
Chlamydia: NEGATIVE
Comment: NEGATIVE
Comment: NORMAL
Neisseria Gonorrhea: NEGATIVE

## 2023-05-30 LAB — HIV-1 RNA QUANT-NO REFLEX-BLD
HIV 1 RNA Quant: NOT DETECTED {copies}/mL
HIV-1 RNA Quant, Log: NOT DETECTED {Log_copies}/mL

## 2023-06-04 ENCOUNTER — Other Ambulatory Visit (HOSPITAL_COMMUNITY): Payer: Self-pay

## 2023-06-05 ENCOUNTER — Other Ambulatory Visit (HOSPITAL_COMMUNITY): Payer: Self-pay

## 2023-06-05 ENCOUNTER — Telehealth: Payer: Self-pay

## 2023-06-05 NOTE — Telephone Encounter (Signed)
 Pharmacy Patient Advocate Encounter- Apretude BIV-Medical Benefit:  J code: [id number] CPT code: 19147  Dx Code: Z72.53  No PA was submitted to [facility]  Authorization# [id number]  NOTIFICATION WILL BE IN MEDIA.

## 2023-07-17 NOTE — Progress Notes (Deleted)
 HPI: TU BAYLE is a 43 y.o. male who presents to the [facility] pharmacy clinic for Apretude  administration and HIV PrEP follow up.  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   CHOLECALCIFEROL (VITAMIN D-3 PO)    Take 1 tablet by mouth daily.   CYANOCOBALAMIN (VITAMIN B-12 PO)    Take 1 tablet by mouth daily.   DOXYCYCLINE  (VIBRA -TABS) 100 MG TABLET    Take 200 mg (2 tablets) by mouth 24-72 hours after sexual encounter. Call office for refills as needed.   MECLIZINE  (ANTIVERT ) 25 MG TABLET    Take 1 tablet (25 mg total) by mouth 3 (three) times daily as needed for dizziness.   MULTIPLE VITAMINS-MINERALS (MULTIVITAMIN MEN) TABS    Take 1 tablet by mouth daily.   NAPROXEN  (NAPROSYN ) 375 MG TABLET    Take 1 tablet (375 mg total) by mouth 2 (two) times daily with a meal.  Modified Medications   No medications on file  Discontinued Medications   No medications on file    Allergies: Allergies  Allergen Reactions   Casein Nausea And Vomiting and Other (See Comments)    Dehydration Migraines Extreme nausea, vomiting Constipation   Milk Protein Nausea And Vomiting and Other (See Comments)    Dehydration Migraines Extreme nausea, vomiting Constipation     Past Medical History: Past Medical History:  Diagnosis Date   Depression 03/25/2010   History   Eczema 2010   intermittent / seasonal on soles of feet bilaterally   Examination of participant or control in clinical research 02/12/2016    Social History: Social History   Socioeconomic History   Marital status: Single    Spouse name: Not on file   Number of children: Not on  file   Years of education: Not on file   Highest education level: Not on file  Occupational History   Not on file  Tobacco Use   Smoking status: Never   Smokeless tobacco: Never  Vaping Use   Vaping status: Never Used  Substance and Sexual Activity   Alcohol use: Yes    Alcohol/week: 2.0 standard drinks of alcohol    Types: 2 Shots of liquor per week    Comment: 1x/month   Drug use: No   Sexual activity: Yes    Comment: Pansexual  Other Topics Concern   Not on file  Social History Narrative   Not on file   Social Drivers of Health   Financial Resource Strain: Not on file  Food Insecurity: Low Risk  (04/08/2023)   Received from [facility]   Hunger Vital Sign    Worried About Running Out of Food in the Last Year: Never true    Ran Out of Food in the Last Year: Never true  Transportation Needs: No Transportation Needs (04/08/2023)   Received from [facility]    In the past 12 months, has lack of reliable transportation kept you from medical appointments, meetings, work or from getting things needed for daily living? : No  Physical Activity: Not on file  Stress: Not on file  Social Connections: Not on file  Labs: Lab Results  Component Value Date   HIV1RNAQUANT Not Detected 05/27/2023   HIV1RNAQUANT Not Detected 04/01/2023   HIV1RNAQUANT Not Detected 01/29/2023    RPR and STI Lab Results  Component Value Date   LABRPR NON-REACTIVE 04/01/2023   LABRPR NON-REACTIVE 09/24/2022   LABRPR NON-REACTIVE 01/22/2022   LABRPR NON-REACTIVE 09/26/2021   LABRPR NON-REACTIVE 03/28/2021    STI Results GC CT  05/27/2023  9:31 AM Negative  Negative   05/25/2023  8:41 AM Positive  Negative   04/01/2023  3:33 PM Negative    Negative  Negative    Negative   01/29/2023 10:17 AM Negative    Negative  Negative    Negative   12/04/2022 10:53 AM Negative  Negative   12/04/2022 10:21 AM Negative  Negative   09/24/2022  3:25 PM Negative    Negative  Negative     Negative   07/23/2022 10:02 AM Negative    Negative  Negative    Negative   06/06/2022  3:57 PM Negative  Negative   05/28/2022  9:14 AM Positive  Negative   05/28/2022  9:06 AM Negative  Negative   03/29/2022 10:10 AM Negative  Negative   01/22/2022  9:31 AM Negative    Negative  Negative    Negative   12/04/2021  9:49 AM Negative  Negative   09/26/2021  9:58 AM Negative  Negative   07/25/2021 10:43 AM Negative    Negative  Negative    Negative   07/25/2021  9:26 AM Negative  Negative   03/28/2021 10:16 AM Negative  Negative   03/28/2021  9:56 AM Negative  Negative   01/31/2021  9:38 AM Negative  Negative     Hepatitis B Lab Results  Component Value Date   HEPBSAB REACTIVE (A) 05/28/2022   HEPBSAG NEGATIVE 02/01/2016   HEPBCAB NON REACTIVE 02/12/2016   Hepatitis C Lab Results  Component Value Date   HEPCAB NON-REACTIVE 09/26/2020   Hepatitis A Lab Results  Component Value Date   HAV REACTIVE (A) 01/29/2023   Lipids: Lab Results  Component Value Date   CHOL 133 02/09/2018   TRIG 46 02/09/2018   HDL 60 02/09/2018   CHOLHDL 2.2 02/09/2018   VLDL 8 02/12/2016   LDLCALC 60 02/09/2018    TARGET DATE: The 7th of the month  Assessment: Sudais presents today for their Apretude  injection and to follow up for HIV PrEP. No issues with past injections.  Screened patient for acute HIV symptoms such as fatigue, muscle aches, rash, sore throat, lymphadenopathy, headache, night sweats, nausea/vomiting/diarrhea, and fever. Patient denies any symptoms.   Per [employer] guidelines, a rapid HIV test should be drawn prior to Apretude  administration. Due to state shortage of rapid HIV tests, this is temporarily unable to be done. Per decision from [facility] physicians, we will proceed with Apretude  administration at this time without a negative rapid HIV test beforehand. HIV RNA was collected today and is in process.  Administered cabotegravir  600mg /11mL in *** upper outer quadrant of the  gluteal muscle. Will make follow up appointments for maintenance injections every 2 months.   No known exposures to any STIs and no signs or symptoms of any STIs today. Last STI screening was in March was positive for gonorrhea after he had a known exposure; appropriately treated with ceftriaxone  500 mg x 1. *** new partners since last injection; will check RPR and urine/oral cytologies today.   Currently up-to-date on vaccines.   Plan: - Administer Apretude  600  mg x 1  - Maintenance injections scheduled for 7/1 with me  - Check HIV RNA, RPR, and urine/oral cytologies - Call with any issues or questions  Nicklas Barns, PharmD, CPP, BCIDP, AAHIVP Clinical Pharmacist Practitioner Infectious Diseases Clinical Pharmacist [facility]

## 2023-07-24 ENCOUNTER — Ambulatory Visit: Admitting: Pharmacist

## 2023-07-24 DIAGNOSIS — Z113 Encounter for screening for infections with a predominantly sexual mode of transmission: Secondary | ICD-10-CM

## 2023-07-24 DIAGNOSIS — Z2981 Encounter for HIV pre-exposure prophylaxis: Secondary | ICD-10-CM

## 2023-07-28 NOTE — Progress Notes (Addendum)
 HPI: Glenn Rivera is a 43 y.o. male who presents to the [facility] pharmacy clinic for Apretude  administration and HIV PrEP follow up.  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   CHOLECALCIFEROL (VITAMIN D-3 PO)    Take 1 tablet by mouth daily.   CYANOCOBALAMIN (VITAMIN B-12 PO)    Take 1 tablet by mouth daily.   DOXYCYCLINE  (VIBRA -TABS) 100 MG TABLET    Take 200 mg (2 tablets) by mouth 24-72 hours after sexual encounter. Call office for refills as needed.   MECLIZINE  (ANTIVERT ) 25 MG TABLET    Take 1 tablet (25 mg total) by mouth 3 (three) times daily as needed for dizziness.   MULTIPLE VITAMINS-MINERALS (MULTIVITAMIN MEN) TABS    Take 1 tablet by mouth daily.   NAPROXEN  (NAPROSYN ) 375 MG TABLET    Take 1 tablet (375 mg total) by mouth 2 (two) times daily with a meal.  Modified Medications   No medications on file  Discontinued Medications   No medications on file    Allergies: Allergies  Allergen Reactions   Casein Nausea And Vomiting and Other (See Comments)    Dehydration Migraines Extreme nausea, vomiting Constipation   Milk Protein Nausea And Vomiting and Other (See Comments)    Dehydration Migraines Extreme nausea, vomiting Constipation     Past Medical History: Past Medical History:  Diagnosis Date   Depression 03/25/2010   History   Eczema 2010   intermittent / seasonal on soles of feet bilaterally   Examination of participant or control in clinical research 02/12/2016    Social History: Social History   Socioeconomic History   Marital status: Single    Spouse name: Not on file   Number of children: Not on  file   Years of education: Not on file   Highest education level: Not on file  Occupational History   Not on file  Tobacco Use   Smoking status: Never   Smokeless tobacco: Never  Vaping Use   Vaping status: Never Used  Substance and Sexual Activity   Alcohol use: Yes    Alcohol/week: 2.0 standard drinks of alcohol    Types: 2 Shots of liquor per week    Comment: 1x/month   Drug use: No   Sexual activity: Yes    Comment: Pansexual  Other Topics Concern   Not on file  Social History Narrative   Not on file   Social Drivers of Health   Financial Resource Strain: Not on file  Food Insecurity: Low Risk  (04/08/2023)   Received from [facility]   Hunger Vital Sign    Worried About Running Out of Food in the Last Year: Never true    Ran Out of Food in the Last Year: Never true  Transportation Needs: No Transportation Needs (04/08/2023)   Received from [facility]    In the past 12 months, has lack of reliable transportation kept you from medical appointments, meetings, work or from getting things needed for daily living? : No  Physical Activity: Not on file  Stress: Not on file  Social Connections: Not on file  Labs: Lab Results  Component Value Date   HIV1RNAQUANT Not Detected 05/27/2023   HIV1RNAQUANT Not Detected 04/01/2023   HIV1RNAQUANT Not Detected 01/29/2023    RPR and STI Lab Results  Component Value Date   LABRPR NON-REACTIVE 04/01/2023   LABRPR NON-REACTIVE 09/24/2022   LABRPR NON-REACTIVE 01/22/2022   LABRPR NON-REACTIVE 09/26/2021   LABRPR NON-REACTIVE 03/28/2021    STI Results GC CT  05/27/2023  9:31 AM Negative  Negative   05/25/2023  8:41 AM Positive  Negative   04/01/2023  3:33 PM Negative    Negative  Negative    Negative   01/29/2023 10:17 AM Negative    Negative  Negative    Negative   12/04/2022 10:53 AM Negative  Negative   12/04/2022 10:21 AM Negative  Negative   09/24/2022  3:25 PM Negative    Negative  Negative     Negative   07/23/2022 10:02 AM Negative    Negative  Negative    Negative   06/06/2022  3:57 PM Negative  Negative   05/28/2022  9:14 AM Positive  Negative   05/28/2022  9:06 AM Negative  Negative   03/29/2022 10:10 AM Negative  Negative   01/22/2022  9:31 AM Negative    Negative  Negative    Negative   12/04/2021  9:49 AM Negative  Negative   09/26/2021  9:58 AM Negative  Negative   07/25/2021 10:43 AM Negative    Negative  Negative    Negative   07/25/2021  9:26 AM Negative  Negative   03/28/2021 10:16 AM Negative  Negative   03/28/2021  9:56 AM Negative  Negative   01/31/2021  9:38 AM Negative  Negative     Hepatitis B Lab Results  Component Value Date   HEPBSAB REACTIVE (A) 05/28/2022   HEPBSAG NEGATIVE 02/01/2016   HEPBCAB NON REACTIVE 02/12/2016   Hepatitis C Lab Results  Component Value Date   HEPCAB NON-REACTIVE 09/26/2020   Hepatitis A Lab Results  Component Value Date   HAV REACTIVE (A) 01/29/2023   Lipids: Lab Results  Component Value Date   CHOL 133 02/09/2018   TRIG 46 02/09/2018   HDL 60 02/09/2018   CHOLHDL 2.2 02/09/2018   VLDL 8 02/12/2016   LDLCALC 60 02/09/2018    TARGET DATE: The 7th of the month  Assessment: Glenn Rivera presents today for their Apretude  injection and to follow up for HIV PrEP. No issues with past injections.  Screened patient for acute HIV symptoms such as fatigue, muscle aches, rash, sore throat, lymphadenopathy, headache, night sweats, nausea/vomiting/diarrhea, and fever. Patient denies any symptoms.   Per [employer] guidelines, a rapid HIV test should be drawn prior to Apretude  administration. Due to state shortage of rapid HIV tests, this is temporarily unable to be done. Per decision from [facility] physicians, we will proceed with Apretude  administration at this time without a negative rapid HIV test beforehand. HIV RNA was collected today and is in process.  Administered cabotegravir  600mg /45mL in right upper outer quadrant of  the gluteal muscle. Will make follow up appointments for maintenance injections every 2 months.   No known exposures to any STIs since March and no signs or symptoms of any STIs today. Last STI screening was in March was positive for gonorrhea after he had a known exposure; appropriately treated with ceftriaxone  500 mg x 1. No new partners since last injection; will check RPR and urine/oral cytologies today.   Currently up-to-date on vaccines. States he did receive full  HPV vaccine series in college though it is not recorded in [facility].   Plan: - Administer Apretude  600 mg x 1  - Maintenance injections scheduled for 7/1 with me  - Check HIV RNA, RPR, and urine/oral cytologies - Call with any issues or questions  Nicklas Barns, PharmD, CPP, BCIDP, AAHIVP Clinical Pharmacist Practitioner Infectious Diseases Clinical Pharmacist [facility]

## 2023-07-29 ENCOUNTER — Ambulatory Visit: Admitting: Pharmacist

## 2023-07-29 ENCOUNTER — Other Ambulatory Visit: Payer: Self-pay

## 2023-07-29 DIAGNOSIS — Z79899 Other long term (current) drug therapy: Secondary | ICD-10-CM

## 2023-07-29 DIAGNOSIS — Z2981 Encounter for HIV pre-exposure prophylaxis: Secondary | ICD-10-CM | POA: Diagnosis not present

## 2023-07-29 DIAGNOSIS — Z113 Encounter for screening for infections with a predominantly sexual mode of transmission: Secondary | ICD-10-CM

## 2023-07-29 MED ORDER — CABOTEGRAVIR ER 600 MG/3ML IM SUER
600.0000 mg | Freq: Once | INTRAMUSCULAR | Status: AC
  Administered 2023-07-29: 600 mg via INTRAMUSCULAR

## 2023-07-30 LAB — GC/CHLAMYDIA PROBE, AMP (THROAT)
Chlamydia trachomatis RNA: NOT DETECTED
Neisseria gonorrhoeae RNA: NOT DETECTED

## 2023-07-30 LAB — C. TRACHOMATIS/N. GONORRHOEAE RNA
C. trachomatis RNA, TMA: NOT DETECTED
N. gonorrhoeae RNA, TMA: NOT DETECTED

## 2023-07-31 LAB — HIV-1 RNA QUANT-NO REFLEX-BLD
HIV 1 RNA Quant: NOT DETECTED {copies}/mL
HIV-1 RNA Quant, Log: NOT DETECTED {Log_copies}/mL

## 2023-09-01 ENCOUNTER — Other Ambulatory Visit: Payer: Self-pay | Admitting: Medical Genetics

## 2023-09-03 ENCOUNTER — Other Ambulatory Visit (HOSPITAL_COMMUNITY)
Admission: RE | Admit: 2023-09-03 | Discharge: 2023-09-03 | Disposition: A | Discharge status: Home / Self Care | Payer: Self-pay | Source: Ambulatory Visit | Attending: Medical Genetics | Admitting: Medical Genetics

## 2023-09-09 ENCOUNTER — Ambulatory Visit (HOSPITAL_COMMUNITY): Admission: EM | Admit: 2023-09-09 | Discharge: 2023-09-09 | Disposition: A | Discharge status: Home / Self Care

## 2023-09-09 ENCOUNTER — Ambulatory Visit (INDEPENDENT_AMBULATORY_CARE_PROVIDER_SITE_OTHER)

## 2023-09-09 ENCOUNTER — Encounter (HOSPITAL_COMMUNITY): Payer: Self-pay

## 2023-09-09 DIAGNOSIS — S8391XA Sprain of unspecified site of right knee, initial encounter: Secondary | ICD-10-CM

## 2023-09-09 MED ORDER — DICLOFENAC SODIUM 50 MG PO TBEC: 50.0000 mg | DELAYED_RELEASE_TABLET | Freq: Two times a day (BID) | ORAL | 1 refills | Status: DC
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
## 2023-09-09 NOTE — Discharge Instructions (Addendum)
  1. Sprain of right knee, unspecified ligament, initial encounter (Primary) - DG Knee Complete 4 Views Right x-ray performed in UC shows no acute fracture or acute bony injury. - diclofenac (VOLTAREN) 50 MG EC tablet; Take 1 tablet (50 mg total) by mouth 2 (two) times daily for right knee pain and inflammation - AMB referral to sports medicine for follow-up evaluation of right knee pain for further evaluation and management. - Apply ice to the knee 2-3 times a day for 10 to 15 minutes at a time. - Keep leg elevated when resting to decrease inflammation and pain and minimize heavy lifting or strenuous physical activity until symptoms improve.
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
## 2023-09-10 ENCOUNTER — Ambulatory Visit (HOSPITAL_COMMUNITY): Payer: Self-pay

## 2023-09-13 LAB — GENECONNECT MOLECULAR SCREEN: Genetic Analysis Overall Interpretation: NEGATIVE

## 2023-09-23 ENCOUNTER — Ambulatory Visit: Payer: Self-pay | Admitting: Pharmacist
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
## 2023-09-29 ENCOUNTER — Ambulatory Visit (INDEPENDENT_AMBULATORY_CARE_PROVIDER_SITE_OTHER): Admitting: Sports Medicine

## 2023-09-29 ENCOUNTER — Encounter: Payer: Self-pay | Admitting: Sports Medicine

## 2023-09-29 DIAGNOSIS — G8929 Other chronic pain: Secondary | ICD-10-CM

## 2023-09-29 DIAGNOSIS — M4125 Other idiopathic scoliosis, thoracolumbar region: Secondary | ICD-10-CM | POA: Diagnosis not present

## 2023-09-29 DIAGNOSIS — M2170 Unequal limb length (acquired), unspecified site: Secondary | ICD-10-CM

## 2023-09-29 DIAGNOSIS — M25561 Pain in right knee: Secondary | ICD-10-CM
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
## 2023-09-30 ENCOUNTER — Ambulatory Visit: Admitting: Pharmacist

## 2023-09-30 ENCOUNTER — Other Ambulatory Visit: Payer: Self-pay

## 2023-09-30 ENCOUNTER — Other Ambulatory Visit (HOSPITAL_COMMUNITY)
Admission: RE | Admit: 2023-09-30 | Discharge: 2023-09-30 | Disposition: A | Discharge status: Home / Self Care | Source: Ambulatory Visit | Attending: Infectious Disease | Admitting: Infectious Disease

## 2023-09-30 DIAGNOSIS — Z113 Encounter for screening for infections with a predominantly sexual mode of transmission: Secondary | ICD-10-CM | POA: Insufficient documentation

## 2023-09-30 DIAGNOSIS — Z79899 Other long term (current) drug therapy: Secondary | ICD-10-CM

## 2023-09-30 DIAGNOSIS — Z2981 Encounter for HIV pre-exposure prophylaxis: Secondary | ICD-10-CM | POA: Diagnosis not present

## 2023-09-30 MED ORDER — CABOTEGRAVIR ER 600 MG/3ML IM SUER
600.0000 mg | Freq: Once | INTRAMUSCULAR | Status: AC
  Administered 2023-09-30: 600 mg via INTRAMUSCULAR

## 2023-10-01 LAB — CYTOLOGY, (ORAL, ANAL, URETHRAL) ANCILLARY ONLY
Chlamydia: NEGATIVE
Comment: NEGATIVE
Comment: NORMAL
Neisseria Gonorrhea: NEGATIVE

## 2023-10-01 LAB — URINE CYTOLOGY ANCILLARY ONLY
Chlamydia: NEGATIVE
Comment: NEGATIVE
Comment: NORMAL
Neisseria Gonorrhea: NEGATIVE

## 2023-10-02 LAB — HIV-1 RNA QUANT-NO REFLEX-BLD
HIV 1 RNA Quant: NOT DETECTED {copies}/mL
HIV-1 RNA Quant, Log: NOT DETECTED {Log_copies}/mL

## 2023-10-02 LAB — RPR: RPR Ser Ql: NONREACTIVE
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
## 2023-10-09 ENCOUNTER — Ambulatory Visit: Admitting: Physical Therapy
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
## 2023-10-21 ENCOUNTER — Ambulatory Visit: Admitting: Physical Therapy
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
## 2023-10-30 ENCOUNTER — Other Ambulatory Visit: Payer: Self-pay

## 2023-10-30 ENCOUNTER — Encounter: Payer: Self-pay | Admitting: Physical Therapy

## 2023-10-30 ENCOUNTER — Ambulatory Visit: Attending: Sports Medicine | Admitting: Physical Therapy

## 2023-10-30 DIAGNOSIS — M6281 Muscle weakness (generalized): Secondary | ICD-10-CM | POA: Insufficient documentation

## 2023-10-30 DIAGNOSIS — G8929 Other chronic pain: Secondary | ICD-10-CM | POA: Diagnosis present

## 2023-10-30 DIAGNOSIS — M2170 Unequal limb length (acquired), unspecified site: Secondary | ICD-10-CM | POA: Diagnosis not present

## 2023-10-30 DIAGNOSIS — M25661 Stiffness of right knee, not elsewhere classified: Secondary | ICD-10-CM | POA: Diagnosis present

## 2023-10-30 DIAGNOSIS — M25561 Pain in right knee: Secondary | ICD-10-CM | POA: Diagnosis present
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
## 2023-11-04 ENCOUNTER — Ambulatory Visit: Admitting: Physical Therapy

## 2023-11-04 ENCOUNTER — Telehealth: Payer: Self-pay | Admitting: Physical Therapy

## 2023-11-04 NOTE — Telephone Encounter (Signed)
 Called pt re: today's missed appt - left voicemail w/ date/time of next appt, provided office call back number and encouraged to reach out with any questions/concerns.

## 2023-11-11 ENCOUNTER — Ambulatory Visit: Admitting: Sports Medicine
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
## 2023-11-13 ENCOUNTER — Ambulatory Visit: Admitting: Physical Therapy
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
## 2023-11-20 ENCOUNTER — Ambulatory Visit: Admitting: Physical Therapy

## 2023-11-20 ENCOUNTER — Encounter: Payer: Self-pay | Admitting: Physical Therapy

## 2023-11-20 DIAGNOSIS — M25661 Stiffness of right knee, not elsewhere classified: Secondary | ICD-10-CM

## 2023-11-20 DIAGNOSIS — G8929 Other chronic pain: Secondary | ICD-10-CM

## 2023-11-20 DIAGNOSIS — M6281 Muscle weakness (generalized): Secondary | ICD-10-CM

## 2023-11-20 DIAGNOSIS — M25561 Pain in right knee: Secondary | ICD-10-CM | POA: Diagnosis not present

## 2023-11-25 NOTE — Progress Notes (Unsigned)
 HPI: Glenn Rivera is a 43 y.o. male who presents to the [facility] pharmacy clinic for Apretude  administration and HIV PrEP follow up.  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   CHOLECALCIFEROL (VITAMIN D-3 PO)    Take 1 tablet by mouth daily.   CYANOCOBALAMIN (VITAMIN B-12 PO)    Take 1 tablet by mouth daily.   DICLOFENAC  (VOLTAREN ) 50 MG EC TABLET    Take 1 tablet (50 mg total) by mouth 2 (two) times daily.   DOXYCYCLINE  (VIBRA -TABS) 100 MG TABLET    Take 200 mg (2 tablets) by mouth 24-72 hours after sexual encounter. Call office for refills as needed.   MULTIPLE VITAMINS-MINERALS (MULTIVITAMIN MEN) TABS    Take 1 tablet by mouth daily.  Modified Medications   No medications on file  Discontinued Medications   No medications on file    Allergies: Allergies  Allergen Reactions   Casein Nausea And Vomiting and Other (See Comments)    Dehydration Migraines Extreme nausea, vomiting Constipation   Milk Protein Nausea And Vomiting and Other (See Comments)    Dehydration Migraines Extreme nausea, vomiting Constipation     Past Medical History: Past Medical History:  Diagnosis Date   Depression 03/25/2010   History   Eczema 2010   intermittent / seasonal on soles of feet bilaterally   Examination of participant or control in clinical research 02/12/2016    Social History: Social History   Socioeconomic History   Marital status: Single    Spouse name: Not on file   Number of children: Not on file   Years of education: Not on file   Highest education level: Not on file   Occupational History   Not on file  Tobacco Use   Smoking status: Never   Smokeless tobacco: Never  Vaping Use   Vaping status: Never Used  Substance and Sexual Activity   Alcohol use: Yes    Alcohol/week: 2.0 standard drinks of alcohol    Types: 2 Shots of liquor per week    Comment: 1x/month   Drug use: No   Sexual activity: Yes    Comment: Pansexual  Other Topics Concern   Not on file  Social History Narrative   Not on file   Social Drivers of Health   Financial Resource Strain: Not on file  Food Insecurity: Low Risk  (04/08/2023)   Received from [facility]   Hunger Vital Sign    Within the past 12 months, you worried that your food would run out before you got money to buy more: Never true    Within the past 12 months, the food you bought just didn't last and you didn't have money to get more. : Never true  Transportation Needs: No Transportation Needs (04/08/2023)   Received from [facility]    In the past 12 months, has lack of reliable transportation kept you from medical appointments, meetings, work or from getting things needed for daily living? : No  Physical Activity: Not on file  Stress: Not on file  Social Connections: Not  on file    Labs: Lab Results  Component Value Date   HIV1RNAQUANT NOT DETECTED 09/30/2023   HIV1RNAQUANT NOT DETECTED 07/29/2023   HIV1RNAQUANT Not Detected 05/27/2023    RPR and STI Lab Results  Component Value Date   LABRPR NON-REACTIVE 09/30/2023   LABRPR NON-REACTIVE 04/01/2023   LABRPR NON-REACTIVE 09/24/2022   LABRPR NON-REACTIVE 01/22/2022   LABRPR NON-REACTIVE 09/26/2021    STI Results GC CT  09/30/2023  9:20 AM Negative    Negative  Negative    Negative   05/27/2023  9:31 AM Negative  Negative   05/25/2023  8:41 AM Positive  Negative   04/01/2023  3:33 PM Negative    Negative  Negative    Negative   01/29/2023 10:17 AM Negative    Negative  Negative    Negative   12/04/2022 10:53 AM Negative   Negative   12/04/2022 10:21 AM Negative  Negative   09/24/2022  3:25 PM Negative    Negative  Negative    Negative   07/23/2022 10:02 AM Negative    Negative  Negative    Negative   06/06/2022  3:57 PM Negative  Negative   05/28/2022  9:14 AM Positive  Negative   05/28/2022  9:06 AM Negative  Negative   03/29/2022 10:10 AM Negative  Negative   01/22/2022  9:31 AM Negative    Negative  Negative    Negative   12/04/2021  9:49 AM Negative  Negative   09/26/2021  9:58 AM Negative  Negative   07/25/2021 10:43 AM Negative    Negative  Negative    Negative   07/25/2021  9:26 AM Negative  Negative   03/28/2021 10:16 AM Negative  Negative   03/28/2021  9:56 AM Negative  Negative     Hepatitis B Lab Results  Component Value Date   HEPBSAB REACTIVE (A) 05/28/2022   HEPBSAG NEGATIVE 02/01/2016   HEPBCAB NON REACTIVE 02/12/2016   Hepatitis C Lab Results  Component Value Date   HEPCAB NON-REACTIVE 09/26/2020   Hepatitis A Lab Results  Component Value Date   HAV REACTIVE (A) 01/29/2023   Lipids: Lab Results  Component Value Date   CHOL 133 02/09/2018   TRIG 46 02/09/2018   HDL 60 02/09/2018   CHOLHDL 2.2 02/09/2018   VLDL 8 02/12/2016   LDLCALC 60 02/09/2018    TARGET DATE: The 7th of the month  Assessment: Laron presents today for their Apretude  injection and to follow up for HIV PrEP. No issues with past injections.  Screened patient for acute HIV symptoms such as fatigue, muscle aches, rash, sore throat, lymphadenopathy, headache, night sweats, nausea/vomiting/diarrhea, and fever. Patient denies any symptoms.   Per [employer] guidelines, a rapid HIV test should be drawn prior to Apretude  administration. Due to state shortage of rapid HIV tests, this is temporarily unable to be done. Per decision from [facility] physicians, we will proceed with Apretude  administration at this time without a negative rapid HIV test beforehand. HIV RNA was collected today and is in  process.  Administered cabotegravir  600mg /70mL in right upper outer quadrant of the gluteal muscle. Will make follow up appointments for maintenance injections every 2 months.   No known exposures to any STIs and no signs or symptoms of any STIs today. Last STI screening was in July and was negative. Has been active with one new partner since last injection; will check RPR and urine/oral cytologies today.   Plan: - Administer Apretude  600 mg x 1  -  Maintenance injections scheduled for 11/4 with me  - Check HIV RNA, RPR, and urine/oral cytologies  - Call with any issues or questions  Alan Geralds, PharmD, CPP, BCIDP, AAHIVP Clinical Pharmacist Practitioner Infectious Diseases Clinical Pharmacist [facility]

## 2023-11-27 ENCOUNTER — Ambulatory Visit: Admitting: Pharmacist

## 2023-11-27 ENCOUNTER — Other Ambulatory Visit: Payer: Self-pay

## 2023-11-27 ENCOUNTER — Ambulatory Visit: Admitting: Rehabilitative and Restorative Service Providers"

## 2023-11-27 ENCOUNTER — Other Ambulatory Visit (HOSPITAL_COMMUNITY)
Admission: RE | Admit: 2023-11-27 | Discharge: 2023-11-27 | Disposition: A | Discharge status: Home / Self Care | Source: Ambulatory Visit | Attending: Infectious Disease | Admitting: Infectious Disease

## 2023-11-27 DIAGNOSIS — Z113 Encounter for screening for infections with a predominantly sexual mode of transmission: Secondary | ICD-10-CM

## 2023-11-27 DIAGNOSIS — Z79899 Other long term (current) drug therapy: Secondary | ICD-10-CM

## 2023-11-27 DIAGNOSIS — Z2981 Encounter for HIV pre-exposure prophylaxis: Secondary | ICD-10-CM | POA: Diagnosis not present

## 2023-11-27 MED ORDER — CABOTEGRAVIR ER 600 MG/3ML IM SUER
600.0000 mg | Freq: Once | INTRAMUSCULAR | Status: AC
  Administered 2023-11-27: 600 mg via INTRAMUSCULAR

## 2023-11-28 LAB — URINE CYTOLOGY ANCILLARY ONLY
Chlamydia: NEGATIVE
Comment: NEGATIVE
Comment: NORMAL
Neisseria Gonorrhea: NEGATIVE

## 2023-11-28 LAB — CYTOLOGY, (ORAL, ANAL, URETHRAL) ANCILLARY ONLY
Chlamydia: NEGATIVE
Comment: NEGATIVE
Comment: NORMAL
Neisseria Gonorrhea: NEGATIVE

## 2023-11-29 LAB — HIV-1 RNA QUANT-NO REFLEX-BLD
HIV 1 RNA Quant: NOT DETECTED {copies}/mL
HIV-1 RNA Quant, Log: NOT DETECTED {Log_copies}/mL

## 2023-11-29 LAB — RPR: RPR Ser Ql: NONREACTIVE

## 2024-01-05 ENCOUNTER — Other Ambulatory Visit (HOSPITAL_COMMUNITY): Payer: Self-pay

## 2024-01-22 ENCOUNTER — Other Ambulatory Visit (HOSPITAL_COMMUNITY): Payer: Self-pay

## 2024-01-26 ENCOUNTER — Encounter: Payer: Self-pay | Admitting: Radiology

## 2024-01-26 NOTE — Progress Notes (Unsigned)
 HPI: Glenn Rivera is a 43 y.o. male who presents to the [facility] pharmacy clinic for Apretude  administration and HIV PrEP follow up.  Insured   [x]    Uninsured  []    Referring ID Physician: Dr. 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   CHOLECALCIFEROL (VITAMIN D-3 PO)    Take 1 tablet by mouth daily.   CYANOCOBALAMIN (VITAMIN B-12 PO)    Take 1 tablet by mouth daily.   DICLOFENAC  (VOLTAREN ) 50 MG EC TABLET    Take 1 tablet (50 mg total) by mouth 2 (two) times daily.   DOXYCYCLINE  (VIBRA -TABS) 100 MG TABLET    Take 200 mg (2 tablets) by mouth 24-72 hours after sexual encounter. Call office for refills as needed.   MULTIPLE VITAMINS-MINERALS (MULTIVITAMIN MEN) TABS    Take 1 tablet by mouth daily.  Modified Medications   No medications on file  Discontinued Medications   No medications on file    Allergies: Allergies  Allergen Reactions   Casein Nausea And Vomiting and Other (See Comments)    Dehydration Migraines Extreme nausea, vomiting Constipation   Milk Protein Nausea And Vomiting and Other (See Comments)    Dehydration Migraines Extreme nausea, vomiting Constipation     Past Medical History: Past Medical History:  Diagnosis Date   Depression 03/25/2010   History   Eczema 2010   intermittent / seasonal on soles of feet bilaterally   Examination of participant or control in clinical research 02/12/2016    Social History: Social History   Socioeconomic History   Marital status: Single    Spouse name: Not on file   Number of children: Not on file   Years of education: Not on file   Highest education  level: Not on file  Occupational History   Not on file  Tobacco Use   Smoking status: Never   Smokeless tobacco: Never  Vaping Use   Vaping status: Never Used  Substance and Sexual Activity   Alcohol use: Yes    Alcohol/week: 2.0 standard drinks of alcohol    Types: 2 Shots of liquor per week    Comment: 1x/month   Drug use: No   Sexual activity: Yes    Comment: Pansexual  Other Topics Concern   Not on file  Social History Narrative   Not on file   Social Drivers of Health   Financial Resource Strain: Not on file  Food Insecurity: Low Risk  (04/08/2023)   Received from [facility]   Hunger Vital Sign    Within the past 12 months, you worried that your food would run out before you got money to buy more: Never true    Within the past 12 months, the food you bought just didn't last and you didn't have money to get more. : Never true  Transportation Needs: No Transportation Needs (04/08/2023)   Received from [facility]    In the past 12 months, has lack of reliable transportation kept you from medical appointments, meetings, work or from getting things needed for daily living? : No  Physical Activity: Not  on file  Stress: Not on file  Social Connections: Not on file    Labs: Lab Results  Component Value Date   HIV1RNAQUANT NOT DETECTED 11/27/2023   HIV1RNAQUANT NOT DETECTED 09/30/2023   HIV1RNAQUANT NOT DETECTED 07/29/2023    RPR and STI Lab Results  Component Value Date   LABRPR NON-REACTIVE 11/27/2023   LABRPR NON-REACTIVE 09/30/2023   LABRPR NON-REACTIVE 04/01/2023   LABRPR NON-REACTIVE 09/24/2022   LABRPR NON-REACTIVE 01/22/2022    STI Results GC CT  11/27/2023  9:40 AM Negative    Negative  Negative    Negative   09/30/2023  9:20 AM Negative    Negative  Negative    Negative   05/27/2023  9:31 AM Negative  Negative   05/25/2023  8:41 AM Positive  Negative   04/01/2023  3:33 PM Negative    Negative  Negative    Negative    01/29/2023 10:17 AM Negative    Negative  Negative    Negative   12/04/2022 10:53 AM Negative  Negative   12/04/2022 10:21 AM Negative  Negative   09/24/2022  3:25 PM Negative    Negative  Negative    Negative   07/23/2022 10:02 AM Negative    Negative  Negative    Negative   06/06/2022  3:57 PM Negative  Negative   05/28/2022  9:14 AM Positive  Negative   05/28/2022  9:06 AM Negative  Negative   03/29/2022 10:10 AM Negative  Negative   01/22/2022  9:31 AM Negative    Negative  Negative    Negative   12/04/2021  9:49 AM Negative  Negative   09/26/2021  9:58 AM Negative  Negative   07/25/2021 10:43 AM Negative    Negative  Negative    Negative   07/25/2021  9:26 AM Negative  Negative   03/28/2021 10:16 AM Negative  Negative     Hepatitis B Lab Results  Component Value Date   HEPBSAB REACTIVE (A) 05/28/2022   HEPBSAG NEGATIVE 02/01/2016   HEPBCAB NON REACTIVE 02/12/2016   Hepatitis C Lab Results  Component Value Date   HEPCAB NON-REACTIVE 09/26/2020   Hepatitis A Lab Results  Component Value Date   HAV REACTIVE (A) 01/29/2023   Lipids: Lab Results  Component Value Date   CHOL 133 02/09/2018   TRIG 46 02/09/2018   HDL 60 02/09/2018   CHOLHDL 2.2 02/09/2018   VLDL 8 02/12/2016   LDLCALC 60 02/09/2018    TARGET DATE: The 7th of the month  Assessment: Glenn Rivera presents today for their Apretude  injection and to follow up for HIV PrEP. No issues with past injections.  Screened patient for acute HIV symptoms such as fatigue, muscle aches, rash, sore throat, lymphadenopathy, headache, night sweats, nausea/vomiting/diarrhea, and fever. Patient denies any symptoms.   Administered cabotegravir  600mg /39mL in left upper outer quadrant of the gluteal muscle. Will make follow up appointments for maintenance injections every 2 months.   No known exposures to any STIs and no signs or symptoms of any STIs today. Last STI screening was in September and was negative. No new partners  since last injection; will check urine/oral cytologies and RPR today.   Discussed Yeztugo for PrEP. Counseled patient that they will need to complete an oral loading dose. Counseled that patient will take two Sunlenca 300 mg tablets (600 mg total) orally on the first day of their injection and will take two Sunlenca 300 mg tablets (600 mg total) orally the next day regardless of meals.  Counseled patient that Sunlenca is two separate subcutaneous injections in the abdomen every 6 months. Reviewed that the main side effects are injection-site soreness and nodules. Discussed measures for relief including cold packs and over-the-counter anti-inflammatories. States he would like to continue with Apretude  at this time.   Plan: - Administer Apretude  600 mg x 1  - Maintenance injections scheduled for 04/01/24 - Check HIV RNA, RPR, and urine/oral cytologies - Call with any issues or questions  Alan Geralds, PharmD, CPP, BCIDP, AAHIVP Clinical Pharmacist Practitioner Infectious Diseases Clinical Pharmacist [facility]

## 2024-01-27 ENCOUNTER — Other Ambulatory Visit (HOSPITAL_COMMUNITY)
Admission: RE | Admit: 2024-01-27 | Discharge: 2024-01-27 | Disposition: A | Discharge status: Home / Self Care | Source: Ambulatory Visit | Attending: Infectious Disease | Admitting: Infectious Disease

## 2024-01-27 ENCOUNTER — Ambulatory Visit: Payer: Self-pay | Admitting: Pharmacist

## 2024-01-27 ENCOUNTER — Other Ambulatory Visit: Payer: Self-pay

## 2024-01-27 DIAGNOSIS — Z113 Encounter for screening for infections with a predominantly sexual mode of transmission: Secondary | ICD-10-CM | POA: Diagnosis present

## 2024-01-27 DIAGNOSIS — Z79899 Other long term (current) drug therapy: Secondary | ICD-10-CM

## 2024-01-27 MED ORDER — CABOTEGRAVIR ER 600 MG/3ML IM SUER
600.0000 mg | Freq: Once | INTRAMUSCULAR | Status: AC
  Administered 2024-01-27: 600 mg via INTRAMUSCULAR

## 2024-01-28 LAB — URINE CYTOLOGY ANCILLARY ONLY
Chlamydia: NEGATIVE
Comment: NEGATIVE
Comment: NORMAL
Neisseria Gonorrhea: NEGATIVE

## 2024-01-28 LAB — CYTOLOGY, (ORAL, ANAL, URETHRAL) ANCILLARY ONLY
Chlamydia: NEGATIVE
Comment: NEGATIVE
Comment: NORMAL
Neisseria Gonorrhea: NEGATIVE

## 2024-01-30 LAB — HIV-1 RNA QUANT-NO REFLEX-BLD
HIV 1 RNA Quant: NOT DETECTED {copies}/mL
HIV-1 RNA Quant, Log: NOT DETECTED {Log_copies}/mL

## 2024-01-30 LAB — RPR: RPR Ser Ql: NONREACTIVE

## 2024-03-31 NOTE — Progress Notes (Signed)
 "  HPI: Glenn Rivera is a 44 y.o. male who presents to the [facility] pharmacy clinic for Apretude  administration and HIV PrEP follow up.  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   CHOLECALCIFEROL (VITAMIN D-3 PO)    Take 1 tablet by mouth daily.   CYANOCOBALAMIN (VITAMIN B-12 PO)    Take 1 tablet by mouth daily.   DICLOFENAC  (VOLTAREN ) 50 MG EC TABLET    Take 1 tablet (50 mg total) by mouth 2 (two) times daily.   DOXYCYCLINE  (VIBRA -TABS) 100 MG TABLET    Take 200 mg (2 tablets) by mouth 24-72 hours after sexual encounter. Call office for refills as needed.   MULTIPLE VITAMINS-MINERALS (MULTIVITAMIN MEN) TABS    Take 1 tablet by mouth daily.  Modified Medications   No medications on file  Discontinued Medications   No medications on file    Allergies: Allergies[1]  Labs: Lab Results  Component Value Date   HIV1RNAQUANT NOT DETECTED 01/27/2024   HIV1RNAQUANT NOT DETECTED 11/27/2023   HIV1RNAQUANT NOT DETECTED 09/30/2023    RPR and STI Lab Results  Component Value Date   LABRPR NON-REACTIVE 01/27/2024   LABRPR NON-REACTIVE 11/27/2023   LABRPR NON-REACTIVE 09/30/2023   LABRPR NON-REACTIVE 04/01/2023   LABRPR NON-REACTIVE 09/24/2022    STI Results GC CT  01/27/2024  9:17 AM Negative    Negative  Negative    Negative   11/27/2023  9:40 AM Negative    Negative  Negative    Negative   09/30/2023  9:20 AM Negative    Negative  Negative    Negative   05/27/2023  9:31 AM Negative  Negative   05/25/2023  8:41 AM Positive  Negative   04/01/2023  3:33 PM Negative    Negative  Negative     Negative   01/29/2023 10:17 AM Negative    Negative  Negative    Negative   12/04/2022 10:53 AM Negative  Negative   12/04/2022 10:21 AM Negative  Negative   09/24/2022  3:25 PM Negative    Negative  Negative    Negative   07/23/2022 10:02 AM Negative    Negative  Negative    Negative   06/06/2022  3:57 PM Negative  Negative   05/28/2022  9:14 AM Positive  Negative   05/28/2022  9:06 AM Negative  Negative   03/29/2022 10:10 AM Negative  Negative   01/22/2022  9:31 AM Negative    Negative  Negative    Negative   12/04/2021  9:49 AM Negative  Negative   09/26/2021  9:58 AM Negative  Negative   07/25/2021 10:43 AM Negative    Negative  Negative    Negative   07/25/2021  9:26 AM Negative  Negative     Hepatitis B Lab Results  Component Value Date   HEPBSAB REACTIVE (A) 05/28/2022   HEPBSAG NEGATIVE 02/01/2016   HEPBCAB NON REACTIVE 02/12/2016   Hepatitis C Lab Results  Component Value Date   HEPCAB NON-REACTIVE 09/26/2020   Hepatitis A Lab Results  Component Value Date   HAV REACTIVE (A) 01/29/2023  Lipids: Lab Results  Component Value Date   CHOL 133 02/09/2018   TRIG 46 02/09/2018   HDL 60 02/09/2018   CHOLHDL 2.2 02/09/2018   VLDL 8 02/12/2016   LDLCALC 60 02/09/2018   Target Date: 7th  Assessment: [name] presents today for his Apretude  injection and to follow up for HIV PrEP. No issues with past injections. Denies any symptoms of acute HIV. Last HIV RNA was negative on 01/27/24.   Routine labs:  HIV RNA today; offered STI testing and patient accepted for oral and urine testing and RPR testing.   Eligible vaccinations:  COVID-19, influenza; patient accepts both vaccines. Flu vaccine administered in left deltoid and   Apretude : Administered cabotegravir  600mg /69mL in right upper outer quadrant of the gluteal muscle. Will see Casey back in 2 months for next Apretude  injection, labs, and HIV PrEP follow up.  Plan: - Apretude  injection administered - HIV  RNA today - STI testing today - Next injection, labs, and PrEP follow up appointment scheduled for 05/27/24 at 10:15am with Alan - Call with any issues or questions  Maurilio Patten, PharmD PGY1 Pharmacy Resident [facility] 03/31/2024 7:08 PM     [1]  Allergies Allergen Reactions   Casein Nausea And Vomiting and Other (See Comments)    Dehydration Migraines Extreme nausea, vomiting Constipation   Milk Protein Nausea And Vomiting and Other (See Comments)    Dehydration Migraines Extreme nausea, vomiting Constipation    "

## 2024-04-01 ENCOUNTER — Other Ambulatory Visit (HOSPITAL_COMMUNITY)
Admission: RE | Admit: 2024-04-01 | Discharge: 2024-04-01 | Disposition: A | Source: Ambulatory Visit | Attending: Infectious Disease | Admitting: Infectious Disease

## 2024-04-01 ENCOUNTER — Other Ambulatory Visit: Payer: Self-pay

## 2024-04-01 ENCOUNTER — Ambulatory Visit: Admitting: Pharmacist

## 2024-04-01 DIAGNOSIS — Z2981 Encounter for HIV pre-exposure prophylaxis: Secondary | ICD-10-CM | POA: Diagnosis not present

## 2024-04-01 DIAGNOSIS — Z23 Encounter for immunization: Secondary | ICD-10-CM | POA: Diagnosis not present

## 2024-04-01 DIAGNOSIS — Z113 Encounter for screening for infections with a predominantly sexual mode of transmission: Secondary | ICD-10-CM

## 2024-04-01 DIAGNOSIS — Z79899 Other long term (current) drug therapy: Secondary | ICD-10-CM

## 2024-04-01 MED ORDER — CABOTEGRAVIR ER 600 MG/3ML IM SUER
600.0000 mg | Freq: Once | INTRAMUSCULAR | Status: AC
  Administered 2024-04-01: 600 mg via INTRAMUSCULAR

## 2024-04-01 NOTE — Progress Notes (Signed)
 SABRA

## 2024-04-02 ENCOUNTER — Encounter: Payer: Self-pay | Admitting: Pharmacist

## 2024-04-02 LAB — URINE CYTOLOGY ANCILLARY ONLY
Chlamydia: NEGATIVE
Comment: NEGATIVE
Comment: NORMAL
Neisseria Gonorrhea: NEGATIVE

## 2024-04-02 LAB — CYTOLOGY, (ORAL, ANAL, URETHRAL) ANCILLARY ONLY
Chlamydia: NEGATIVE
Comment: NEGATIVE
Comment: NORMAL
Neisseria Gonorrhea: POSITIVE — AB

## 2024-04-03 ENCOUNTER — Inpatient Hospital Stay: Admission: RE | Admit: 2024-04-03 | Discharge: 2024-04-03 | Attending: Family Medicine

## 2024-04-03 VITALS — BP 125/73 | HR 99 | Temp 99.1°F | Resp 17 | Wt 214.0 lb

## 2024-04-03 DIAGNOSIS — A549 Gonococcal infection, unspecified: Secondary | ICD-10-CM | POA: Diagnosis not present

## 2024-04-03 LAB — SYPHILIS: RPR W/REFLEX TO RPR TITER AND TREPONEMAL ANTIBODIES, TRADITIONAL SCREENING AND DIAGNOSIS ALGORITHM: RPR Ser Ql: NONREACTIVE

## 2024-04-03 LAB — HIV-1 RNA QUANT-NO REFLEX-BLD
HIV 1 RNA Quant: NOT DETECTED {copies}/mL
HIV-1 RNA Quant, Log: NOT DETECTED {Log_copies}/mL

## 2024-04-03 MED ORDER — CEFTRIAXONE SODIUM 500 MG IJ SOLR
500.0000 mg | INTRAMUSCULAR | Status: DC
  Administered 2024-04-03: 500 mg via INTRAMUSCULAR

## 2024-04-03 NOTE — ED Triage Notes (Signed)
 Pt present for STD treatment. Pt tested positive for Gonorrhea. He was seen at another [facility] facility. Pt denies any pain or symptoms.

## 2024-04-03 NOTE — ED Provider Notes (Signed)
 " [facility]    CSN: [id number] Arrival date & time: 04/03/24  0905      History   Chief Complaint Chief Complaint  Patient presents with   SEXUALLY TRANSMITTED DISEASE    HPI Glenn Rivera is a 44 y.o. male presents for gonorrhea treatment.  Patient was seen by infectious disease on January 8 for routine screening.  He had an oral swab done that was positive for gonorrhea.  He also had a urine test for gonorrhea chlamydia that was negative.  He presents today for treatment.  He states he has had gonorrhea in the past and been treated without issue.  He denies any symptoms including sore throat or fevers.  No other concerns.  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 No pertinent surgical history.     Home Medications    Prior to Admission medications  Medication Sig Start Date End Date Taking? Authorizing Provider  BIOTIN PO Take 1 tablet by mouth daily.    [provider]  Cholecalciferol (VITAMIN D-3 PO) Take 1 tablet by mouth daily.    [provider]  Cyanocobalamin (VITAMIN B-12 PO) Take 1 tablet by mouth daily.    [provider]  doxycycline  (VIBRA -TABS) 100 MG tablet Take 200 mg (2 tablets) by mouth 24-72 hours after sexual encounter. Call office for refills as needed. 05/27/23   Waddell Alan PARAS, RPH-CPP  Multiple Vitamins-Minerals (MULTIVITAMIN MEN) TABS Take 1 tablet by mouth daily.    [provider]    Family History History reviewed. No pertinent family  history.  Social History Social History[1]   Allergies   Casein and Milk protein   Review of Systems Review of Systems  Genitourinary:        Gonorrhea treatment     Physical Exam Triage Vital Signs ED Triage Vitals [04/03/24 0912]  Encounter Vitals Group     BP 125/73     Girls Systolic BP Percentile      Girls Diastolic BP Percentile      Boys Systolic BP Percentile      Boys Diastolic BP Percentile      Pulse Rate 99     Resp 17     Temp 99.1 F (37.3 C)     Temp Source Oral     SpO2 97 %     Weight      Height      Head Circumference      Peak Flow      Pain Score 0     Pain Loc      Pain Education      Exclude from Growth Chart    No data found.  Updated Vital Signs BP 125/73 (BP Location: Right Arm)   Pulse 99   Temp 99.1 F (37.3 C) (Oral)   Resp 17   Wt 214 lb (97.1 kg)   SpO2 97%   BMI 29.85 kg/m   Visual Acuity Right Eye Distance:   Left Eye Distance:   Bilateral Distance:  Right Eye Near:   Left Eye Near:    Bilateral Near:     Physical Exam Vitals and nursing note reviewed.  Constitutional:      Appearance: Normal appearance.  HENT:     Head: Normocephalic and atraumatic.  Eyes:     Pupils: Pupils are equal, round, and reactive to light.  Cardiovascular:     Rate and Rhythm: Normal rate.  Pulmonary:     Effort: Pulmonary effort is normal.  Skin:    General: Skin is warm and dry.  Neurological:     General: No focal deficit present.     Mental Status: He is alert and oriented to person, place, and time.  Psychiatric:        Mood and Affect: Mood normal.        Behavior: Behavior normal.      UC Treatments / Results  Labs (all labs ordered are listed, but only abnormal results are displayed) Labs Reviewed - No data to display  Cytology (oral) ancillary only Order: [id number]  Status: Edited Result - FINAL     Next appt: 04/06/2024 at 09:00 AM in Infectious Diseases Oscar JINNY Geralds, RPH-CPP)     Dx: Screening  for STDs (sexually transmit...   Test Result Released: Yes (not seen)   0 Result Notes    Component Ref Range & Units (hover) 2 d ago  Neisseria Gonorrhea Positive Abnormal   Chlamydia Negative  Comment Normal Reference Ranger Chlamydia - Negative  Comment Normal Reference Range Neisseria Gonorrhea - Negative  Resulting Agency [facility]        Specimen Collected: 04/01/24 09:48 Last Resulted: 04/02/24 14:58     EKG   Radiology No results found.  Procedures Procedures (including critical care time)  Medications Ordered in UC Medications  cefTRIAXone  (ROCEPHIN ) injection 500 mg (has no administration in time range)    Initial Impression / Assessment and Plan / UC Course  I have reviewed the triage vital signs and the nursing notes.  Pertinent labs & imaging results that were available during my care of the patient were reviewed by me and considered in my medical decision making (see chart for details).     Labs reviewed showing positive oral gonorrhea.  Patient given ceftriaxone  injection in clinic.  Patient advised to follow-up with ID as needed. Final Clinical Impressions(s) / UC Diagnoses   Final diagnoses:  Gonorrhea     Discharge Instructions      You were given an antibiotic injection in the clinic today for your gonorrhea.  Please follow-up with infectious disease or your PCP as needed.     ED Prescriptions   None    PDMP not reviewed this encounter.    [1]  Social History Tobacco Use   Smoking status: Never   Smokeless tobacco: Never  Vaping Use   Vaping status: Never Used  Substance Use Topics   Alcohol use: Yes    Alcohol/week: 2.0 standard drinks of alcohol    Types: 2 Shots of liquor per week    Comment: 1x/month   Drug use: No     Loreda Myla SAUNDERS, NP 04/03/24 [phone]  "

## 2024-04-03 NOTE — Discharge Instructions (Addendum)
 You were given an antibiotic injection in the clinic today for your gonorrhea.  Please follow-up with infectious disease or your PCP as needed.

## 2024-04-06 ENCOUNTER — Ambulatory Visit: Payer: Self-pay | Admitting: Pharmacist

## 2024-04-08 ENCOUNTER — Other Ambulatory Visit (HOSPITAL_COMMUNITY): Payer: Self-pay

## 2024-05-27 ENCOUNTER — Ambulatory Visit: Payer: Self-pay | Admitting: Pharmacist
